# Patient Record
Sex: Female | Born: 1965 | Race: White | Hispanic: No | Marital: Married | Smoking: Never smoker
Health system: Southern US, Community
[De-identification: ages and names within clinical notes are randomized; demographics above are authoritative.]

## PROBLEM LIST (undated history)

## (undated) DIAGNOSIS — F32A Depression, unspecified: Secondary | ICD-10-CM

## (undated) DIAGNOSIS — E7800 Pure hypercholesterolemia, unspecified: Secondary | ICD-10-CM

## (undated) DIAGNOSIS — F329 Major depressive disorder, single episode, unspecified: Secondary | ICD-10-CM

## (undated) DIAGNOSIS — E119 Type 2 diabetes mellitus without complications: Secondary | ICD-10-CM

## (undated) HISTORY — DX: Major depressive disorder, single episode, unspecified: F32.9

## (undated) HISTORY — DX: Type 2 diabetes mellitus without complications: E11.9

## (undated) HISTORY — DX: Depression, unspecified: F32.A

## (undated) HISTORY — PX: SPINE SURGERY: SHX786

## (undated) HISTORY — PX: KNEE SURGERY: SHX244

## (undated) HISTORY — PX: OTHER SURGICAL HISTORY: SHX169

## (undated) HISTORY — DX: Pure hypercholesterolemia, unspecified: E78.00

## (undated) HISTORY — PX: CARPAL TUNNEL RELEASE: SHX101

---

## 2005-05-25 ENCOUNTER — Other Ambulatory Visit: Admission: RE | Admit: 2005-05-25 | Discharge: 2005-05-25 | Payer: Self-pay | Admitting: Obstetrics and Gynecology

## 2006-05-30 ENCOUNTER — Other Ambulatory Visit: Admission: RE | Admit: 2006-05-30 | Discharge: 2006-05-30 | Payer: Self-pay | Admitting: Obstetrics and Gynecology

## 2006-07-11 ENCOUNTER — Encounter: Admission: RE | Admit: 2006-07-11 | Discharge: 2006-07-11 | Payer: Self-pay | Admitting: Obstetrics and Gynecology

## 2007-07-13 ENCOUNTER — Encounter: Admission: RE | Admit: 2007-07-13 | Discharge: 2007-07-13 | Payer: Self-pay | Admitting: Obstetrics and Gynecology

## 2008-04-23 ENCOUNTER — Ambulatory Visit (HOSPITAL_COMMUNITY): Admission: RE | Admit: 2008-04-23 | Discharge: 2008-04-24 | Payer: Self-pay | Admitting: Neurosurgery

## 2008-08-05 ENCOUNTER — Encounter: Admission: RE | Admit: 2008-08-05 | Discharge: 2008-08-05 | Payer: Self-pay | Admitting: Obstetrics and Gynecology

## 2009-08-08 ENCOUNTER — Encounter: Admission: RE | Admit: 2009-08-08 | Discharge: 2009-08-08 | Payer: Self-pay | Admitting: Obstetrics and Gynecology

## 2010-08-11 ENCOUNTER — Encounter
Admission: RE | Admit: 2010-08-11 | Discharge: 2010-08-11 | Payer: Self-pay | Source: Home / Self Care | Attending: Obstetrics and Gynecology | Admitting: Obstetrics and Gynecology

## 2010-09-02 ENCOUNTER — Encounter
Admission: RE | Admit: 2010-09-02 | Discharge: 2010-09-02 | Payer: Self-pay | Source: Home / Self Care | Attending: Neurosurgery | Admitting: Neurosurgery

## 2011-01-12 NOTE — Op Note (Signed)
NAME:  Victoria Mcdowell                 ACCOUNT NO.:  [id number]   MEDICAL RECORD NO.:  [id number]          PATIENT TYPE:  OIB   LOCATION:  3533                         FACILITY:  [facility]   PHYSICIAN:  Hilda Lias, M.D.   DATE OF BIRTH:  [birth year]   DATE OF PROCEDURE:  DATE OF DISCHARGE:                               OPERATIVE REPORT   PREOPERATIVE DIAGNOSIS:  C5-C6 herniated disk with radiculopathy.   POSTOPERATIVE DIAGNOSIS:  C5-C6 herniated disk with radiculopathy.   PROCEDURES:  1. Anterior C5-C6 diskectomy.  2. Decompression of the spinal cord.  3. Bilateral foraminotomy.  4. Interbody fusion with allograft 8 mm and autograft in the middle      plate from Z6-X0, microscope.   SURGEON:  Hilda Lias, MD   ASSISTANT:  Stefani Dama, MD   CLINICAL HISTORY:  The patient was seen by me complaining of neck pain  radiating to both upper extremities.  X-rays show weakness of the  biceps.  The patient has failed conservative treatment.  Surgery was  advised and the risks were explained to her in the history and physical.   PROCEDURE:  The patient was taken to the OR and after intubation the  left side of the neck was cleaned with DuraPrep.  Drapes were applied.  Transverse incision was made through the skin, subcutaneous tissue down  to the cervical spine.  X-ray showed that indeed we were at the level of  C5-C6.  Then we brought the microscope into the area.  The anterior  ligament was opened with the curette, we did a total diskectomy, we  brought the microscope into the area.  We opened the posterior ligament  and there was a quite a bit of herniated disk centrally and bilaterally,  right worse than the left one.  Decompression of the spinal cord as well  as __________ nerve root was achieved.  Then the endplate were drilled.  An allograft of 8 mm with autograft in the middle was inserted followed  by a plate using 4 screws.  X-ray showed good position of the bone graft  at  the plate.  The area was irrigated.  We waited for 5 minutes just to  be sure that we have had good hemostasis.  Once this was achieved, the  wound was closed with Vicryl and Steri-Strips.           ______________________________  Hilda Lias, M.D.     EB/MEDQ  D:  04/23/2008  T:  04/24/2008  Job:  [id number]

## 2011-04-19 ENCOUNTER — Encounter (HOSPITAL_COMMUNITY)
Admission: RE | Admit: 2011-04-19 | Discharge: 2011-04-19 | Disposition: A | Discharge status: Home / Self Care | Payer: BC Managed Care – PPO | Source: Ambulatory Visit | Attending: Neurosurgery | Admitting: Neurosurgery

## 2011-04-19 LAB — BASIC METABOLIC PANEL
BUN: 13 mg/dL (ref 6–23)
CO2: 24 mEq/L (ref 19–32)
Chloride: 100 mEq/L (ref 96–112)
Creatinine, Ser: 0.62 mg/dL (ref 0.50–1.10)
Glucose, Bld: 265 mg/dL — ABNORMAL HIGH (ref 70–99)
Potassium: 4.7 mEq/L (ref 3.5–5.1)

## 2011-04-19 LAB — CBC
HCT: 39.5 % (ref 36.0–46.0)
Hemoglobin: 13.9 g/dL (ref 12.0–15.0)
MCH: 32 pg (ref 26.0–34.0)
MCV: 91 fL (ref 78.0–100.0)
Platelets: 222 10*3/uL (ref 150–400)
RBC: 4.34 MIL/uL (ref 3.87–5.11)

## 2011-04-23 ENCOUNTER — Inpatient Hospital Stay (HOSPITAL_COMMUNITY)
Admission: RE | Admit: 2011-04-23 | Discharge: 2011-04-28 | DRG: 756 | Disposition: A | Discharge status: Home / Self Care | Payer: BC Managed Care – PPO | Source: Ambulatory Visit | Attending: Neurosurgery | Admitting: Neurosurgery

## 2011-04-23 ENCOUNTER — Inpatient Hospital Stay (HOSPITAL_COMMUNITY): Payer: BC Managed Care – PPO

## 2011-04-23 DIAGNOSIS — Z01810 Encounter for preprocedural cardiovascular examination: Secondary | ICD-10-CM

## 2011-04-23 DIAGNOSIS — Q762 Congenital spondylolisthesis: Secondary | ICD-10-CM

## 2011-04-23 DIAGNOSIS — M51379 Other intervertebral disc degeneration, lumbosacral region without mention of lumbar back pain or lower extremity pain: Principal | ICD-10-CM | POA: Diagnosis present

## 2011-04-23 DIAGNOSIS — Z794 Long term (current) use of insulin: Secondary | ICD-10-CM

## 2011-04-23 DIAGNOSIS — M5137 Other intervertebral disc degeneration, lumbosacral region: Principal | ICD-10-CM | POA: Diagnosis present

## 2011-04-23 DIAGNOSIS — Z01812 Encounter for preprocedural laboratory examination: Secondary | ICD-10-CM

## 2011-04-23 DIAGNOSIS — E119 Type 2 diabetes mellitus without complications: Secondary | ICD-10-CM | POA: Diagnosis present

## 2011-04-23 LAB — ABO/RH: ABO/RH(D): A POS

## 2011-04-23 LAB — GLUCOSE, CAPILLARY
Glucose-Capillary: 152 mg/dL — ABNORMAL HIGH (ref 70–99)
Glucose-Capillary: 241 mg/dL — ABNORMAL HIGH (ref 70–99)

## 2011-04-23 LAB — TYPE AND SCREEN
ABO/RH(D): A POS
Antibody Screen: NEGATIVE

## 2011-04-24 LAB — GLUCOSE, CAPILLARY
Glucose-Capillary: 163 mg/dL — ABNORMAL HIGH (ref 70–99)
Glucose-Capillary: 289 mg/dL — ABNORMAL HIGH (ref 70–99)

## 2011-04-25 LAB — GLUCOSE, CAPILLARY: Glucose-Capillary: 293 mg/dL — ABNORMAL HIGH (ref 70–99)

## 2011-04-26 LAB — GLUCOSE, CAPILLARY
Glucose-Capillary: 251 mg/dL — ABNORMAL HIGH (ref 70–99)
Glucose-Capillary: 258 mg/dL — ABNORMAL HIGH (ref 70–99)

## 2011-04-27 LAB — GLUCOSE, CAPILLARY
Glucose-Capillary: 250 mg/dL — ABNORMAL HIGH (ref 70–99)
Glucose-Capillary: 240 mg/dL — ABNORMAL HIGH (ref 70–99)

## 2011-04-27 NOTE — Op Note (Signed)
NAME:  Victoria Mcdowell                 ACCOUNT NO.:  [id number]  MEDICAL RECORD NO.:  [id number]  LOCATION:  3037                         FACILITY:  [facility]  PHYSICIAN:  Hilda Lias, M.D.   DATE OF BIRTH:  [birth year]  DATE OF PROCEDURE: DATE OF DISCHARGE:                              OPERATIVE REPORT   ADMISSION DIAGNOSES:  L4-5 and L5-S1 fusion with stenosis,  L5-S1 degenerative disk disease with stenosis, radiculopathy, acute on chronic, status post L5-S1 diskectomy.  POSTOPERATIVE DIAGNOSES:  L4-5 and L5-S1 fusion with stenosis,  L5-S1 degenerative disk disease with stenosis, radiculopathy, acute on chronic, status post L5-S1 diskectomy.  PROCEDURES PERFORMED:  L4 and L5 laminectomy and facetectomy, bilateral L4-5 diskectomies _more than routinetwo_________ herniated disk with __________ interbody fusion with cages 12 x 22 __________ Pedicle screws L4, L5 as well. Posterolateral arthrodesis with autograft.  Cell Saver, C-arm.  SURGEON:  Hilda Lias, MD  ASSISTANT:  Coletta Memos, MD  CLINICAL HISTORY:  Ms. Victoria Mcdowell is a lady who in the past underwent L5-S1 diskectomy.  Along with that, she had anterior cervical diskectomy in May 2006.  I have been following her for 4 years, but her pain has continued to get worst.  The pain is mostly back pain, goes to both legs.  She was scheduled to have surgery in February but  she was afraid.  Nevertheless, she came to see me again with her partner about __________ 2 weeks ago, telling me that she was unable to walk and her pain was quite intense.  X-rays showed stenosis plus degenerative disease and spondylolisthesis at L4-5 and L5-S1 and surgery was advised. She and her family knew the risk of the surgery.  PROCEDURE IN DETAIL:  The patient was taken to the OR; and after intubation, she was positioned in a prone manner.  The back was cleaned with DuraPrep.  Midline incision from L3 down to L5-S1 was made through the skin and  subcutaneous tissue all the way down to the cervical spine. Retraction was done all the way laterally.  We were able to see the lateral aspect of the facet of L3, L4, L5, S1.  There is no question then what we found immediately was that the arch of  L4 was quite loose. We did x-ray, which showed that we were at the level of L4-5.  From then on, we proceeded with the removal of spinous process L4-5 as well as the lamina.  We went all the way beyond what normally we do removing along the facets of L4, L5  just to go all the way laterally into the disk space.  At the level of L4-5 diskectomy was accomplished.  The patient had quite a bit of degenerative disk.  The area was quite stenotic.  Decompression of the L3 and L4 nerve root was achieved.  The endplate were drilled..  We were able to insert two cages of 12 x 22 with autograft inside.  At the level of L5-S1, we found not only the stenosis, but quite a bit of adhesion bilaterally.  Lysis was accomplished and we were able to remove not only the lamina, the spinous process, as well as the facet.  We entered the disk space and the area was quite narrow.  The left side worse than right one.  We entered the disk space.  The endplate was removed and two cages of 10 x 22 with autograft were inserted.  Then, using the C-arm in AP view and then in a lateral view, we drilled the pedicle of L4-5, L5-S1.  Prior to insertion of the screws, we drilled the hole in the fourth quadrant just to be sure that was surrounded by bone.  Because of that, we introduced 6 screws with a diameter of 5.5.  The screws were from 40, 35 and 30 in length.  They were connected with a rod and kept in place with caps. A cross-link from left-to-right was used.  Then we went laterally and we removed periosteum of the lateral aspect of L4-5, L5-S1.  Hemostasis was accomplished. Then autograft was used for arthrodesis.  We came back again to the center and we probed the  foramen of L4-5 and S1 and there was plenty of space.  Valsalva maneuver was negative.  Fentanyl was left in the pleural space and the wound was closed with Vicryl and Steri-Strips.          ______________________________ Hilda Lias, M.D.     EB/MEDQ  D:  04/23/2011  T:  04/23/2011  Job:  [id number]  Electronically Signed by Hilda Lias M.D. on 04/27/2011 06:00:53 PM

## 2011-04-27 NOTE — H&P (Signed)
  NAMEMarland Kitchen  Osa Craver NO.:  [id number]  MEDICAL RECORD NO.:  [id number]  LOCATION:  2899                         FACILITY:  [facility]  PHYSICIAN:  Hilda Lias, M.D.   DATE OF BIRTH:  08-Dec-1965  DATE OF ADMISSION:  04/23/2011 DATE OF DISCHARGE:                             HISTORY & PHYSICAL   Ms. Victoria Mcdowell is a lady whom I have been followed for almost 4 years in my office complaining of back pain with radiation to both the legs associated with its thickness and weakness.  She back in 2009 because of her herniated disk in the cervical area and decompression and cervical fusion.  I had been followed her in my office secondary to her back pain.  She has had conservative treatment including physical therapy and medications.  She is not in bed lately.  The pain is constant.  She cannot sleep.  She cannot walk, and because of a clinical findings as well as laboratory findings, she is being admitted for surgery.  PAST MEDICAL HISTORY: 1. Arthroscopy of the knee. 2. Anterior cervical diskectomy.  ALLERGIES:  She is allergic to [facility].  FAMILY HISTORY:  Negative.  SOCIAL HISTORY:  The patient never smoked or drink.  REVIEW OF SYSTEMS:  Positive for high blood pressure, arthritis, diabetes, lower back pain, and leg pain.  PHYSICAL EXAMINATION:  GENERAL:  The patient came to my office and has physical therapist seen.  Every time she come to my office, with family physician. EARS, NOSE, AND THROAT:  Normal. NECK:  No difficulty on the neck. LUNGS:  Clear. HEART:  Heart sounds normal. ABDOMEN:  Normal. EXTREMITIES:  Normal pulses. NEUROLOGIC:  She has a weakness of dorsiflexion on both feet 4/5 with straight-leg raising, is positive of 45 degrees.  She had decreased upper extremity pain of the lumbar spine.  Lumbar x-ray shows degenerative disk disease with spondylolisthesis grade 1 at the level of L4-L5 and severe stenosis with facet arthropathy at the level  of L5-S1.  CLINICAL IMPRESSION:  Lumbar spondylolisthesis at L4-5 with degenerative disk disease and lumbar stenosis at the level of L5-S1.  RECOMMENDATIONS:  The patient is being admitted for surgery.  The procedure will be decompression and fusion at the level of L4-L5 and L5- S1 using pedicle screws and autograft.  The patient knew the risks involved with the surgery include CSF leak, worsening pain, paralysis, and no improvement whatsoever.          ______________________________ Hilda Lias, M.D.     EB/MEDQ  D:  04/23/2011  T:  04/23/2011  Job:  [id number]  Electronically Signed by Hilda Lias M.D. on 04/27/2011 05:57:51 PM

## 2011-04-28 LAB — GLUCOSE, CAPILLARY
Glucose-Capillary: 199 mg/dL — ABNORMAL HIGH (ref 70–99)
Glucose-Capillary: 183 mg/dL — ABNORMAL HIGH (ref 70–99)

## 2011-04-28 NOTE — Discharge Summary (Signed)
  NAMEAnnamarie Mcdowell                 ACCOUNT NO.:  [id number]  MEDICAL RECORD NO.:  [id number]  LOCATION:  3037                         FACILITY:  [facility]  PHYSICIAN:  Hilda Lias, M.D.   DATE OF BIRTH:  January 30, 1966  DATE OF ADMISSION:  04/23/2011 DATE OF DISCHARGE:  04/28/2011                              DISCHARGE SUMMARY   ADMISSION DIAGNOSES: 1. Degenerative disk disease with spondylolisthesis at the level of L4-     5, L5-S1 with chronic radiculopathy. 2. Status post anterior cervical fusion.  POSTOPERATIVE DIAGNOSES: 1. Degenerative disk disease with spondylolisthesis at the level of L4-     5, L5-S1 with chronic radiculopathy. 2. Status post anterior cervical fusion.  CLINICAL HISTORY:  The patient was admitted to the hospital because of back pain with radiation to both legs.  The pain continued to get worse. We were scheduled to have surgical procedure back in February, but she decided to wait.  She came to my office few weeks ago telling me that she cannot live with the pain.  X-ray showed spondylolisthesis and degenerative disk disease at L4-5, L5-S1 with a severe stenosis.  LABORATORY DATA:  Normal.  COURSE IN THE HOSPITAL:  The patient was taken to surgery and decompression and fusion was done at the level of L4-5 and L5-1.  Today, she is ambulating.  She still has some lower back pain, but the pain that she was having prior to surgery was gone.  She is being discharged today to be followed by me in my office.  CONDITION ON DISCHARGE:  Improving.  MEDICATION:  Percocet and baclofen.  DIET:  Regular.  ACTIVITY:  Not to drive for at least 2 weeks.  FOLLOWUP:  She will see me in 3 weeks or before as needed.          ______________________________ Hilda Lias, M.D.    EB/MEDQ  D:  04/28/2011  T:  04/28/2011  Job:  [id number]  Electronically Signed by Hilda Lias M.D. on 04/28/2011 05:01:01 PM

## 2012-09-15 IMAGING — CR DG LUMBAR SPINE 1V
1 series · 1 of 1 positions shown · non-contrast
Comparison: CT myelogram dated 09/02/2010.

CLINICAL DATA: L4-5 and L5-S1 discectomy and pedicle screw
placement.

LUMBAR SPINE - 1 VIEW

[view not recorded]
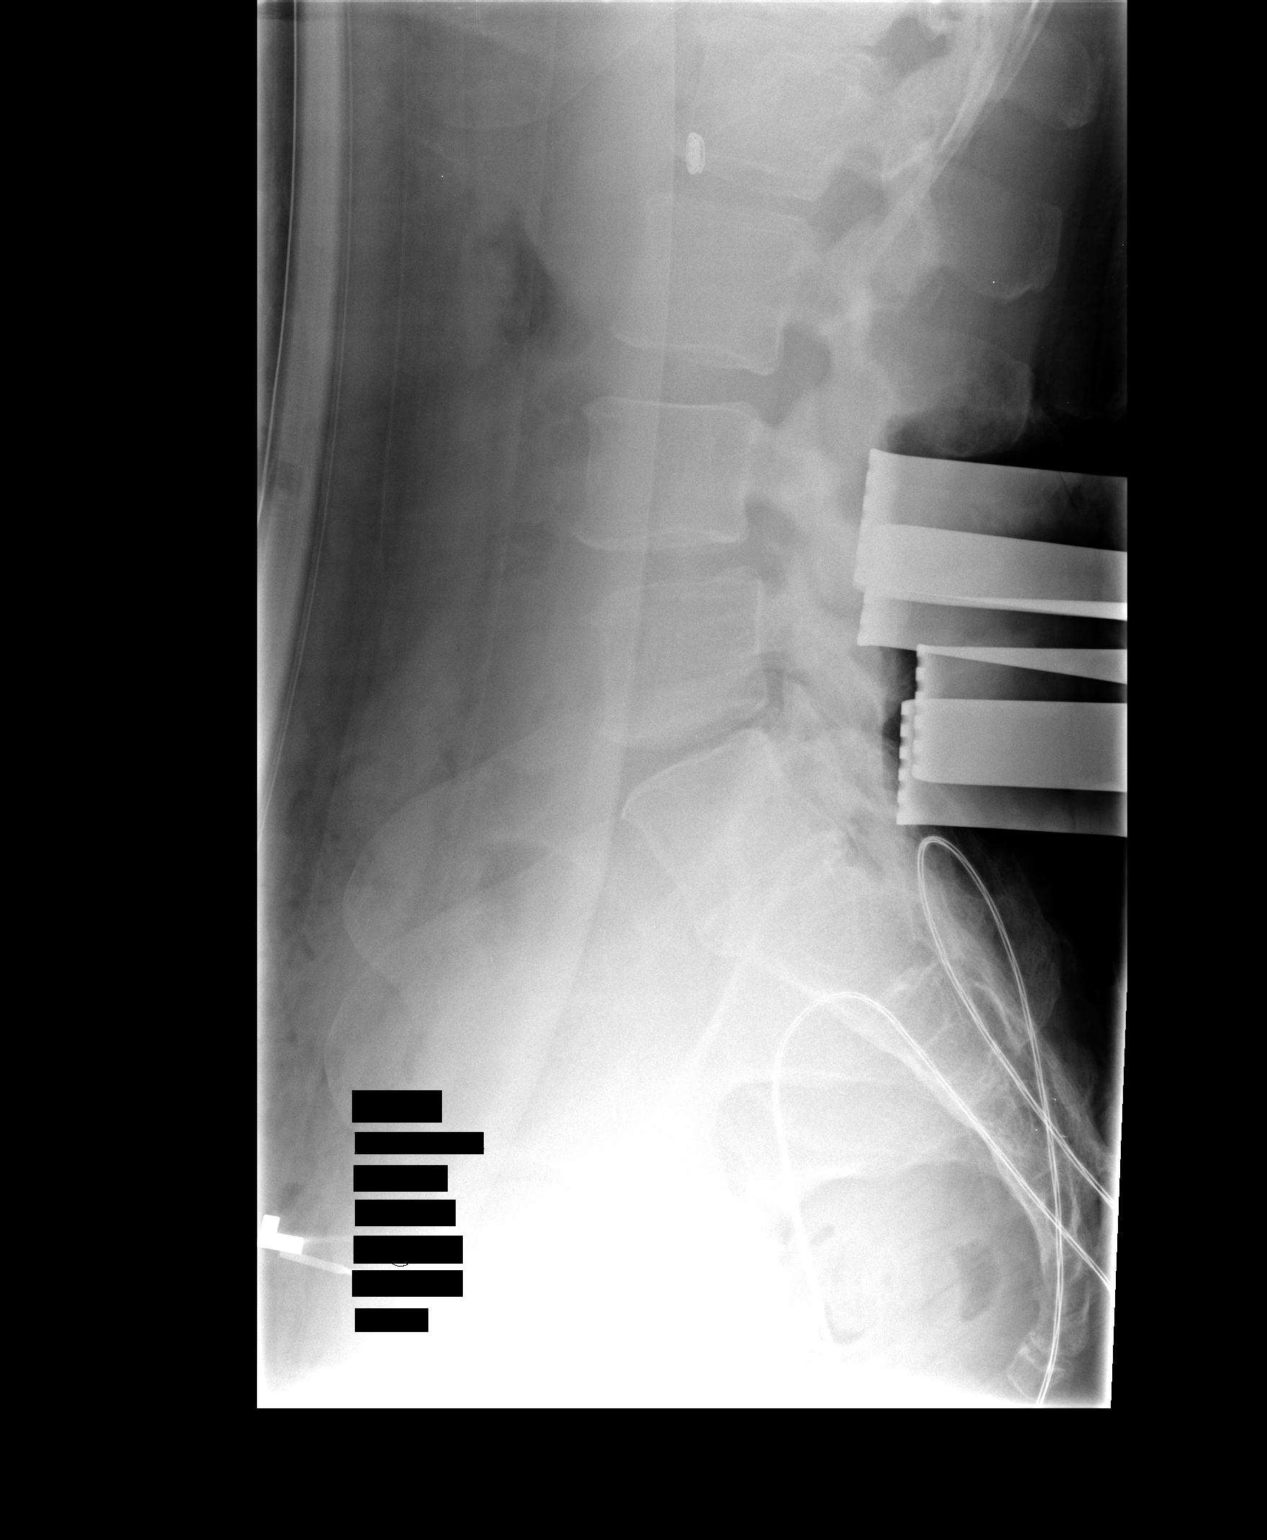

[1 of 1 positions shown; findings below may reference images not displayed]

FINDINGS: For counting purposes, the last open disc space is again
labeled the L5-S1 level.  A transitional S1 vertebra is again
demonstrated.  Surgical spreaders are demonstrated posteriorly at
the L4-5 and L5-1 levels.
IMPRESSION: Surgical spreaders at the L4-5 and L5-S1 levels.

## 2014-01-25 ENCOUNTER — Ambulatory Visit (INDEPENDENT_AMBULATORY_CARE_PROVIDER_SITE_OTHER): Payer: 59 | Admitting: Psychiatry

## 2014-01-25 ENCOUNTER — Encounter (HOSPITAL_COMMUNITY): Payer: Self-pay | Admitting: Psychiatry

## 2014-01-25 VITALS — BP 110/70 | Ht 66.0 in | Wt 195.0 lb

## 2014-01-25 DIAGNOSIS — F332 Major depressive disorder, recurrent severe without psychotic features: Secondary | ICD-10-CM

## 2014-01-25 DIAGNOSIS — F329 Major depressive disorder, single episode, unspecified: Secondary | ICD-10-CM

## 2014-01-25 MED ORDER — TRAZODONE HCL 50 MG PO TABS: 50.0000 mg | ORAL_TABLET | Freq: Every day | ORAL | Status: DC

## 2014-01-25 MED ORDER — DULOXETINE HCL 60 MG PO CPEP: 60.0000 mg | ORAL_CAPSULE | Freq: Every day | ORAL | Status: DC
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
## 2014-02-22 ENCOUNTER — Encounter (HOSPITAL_COMMUNITY): Payer: Self-pay | Admitting: Psychiatry

## 2014-02-22 ENCOUNTER — Ambulatory Visit (INDEPENDENT_AMBULATORY_CARE_PROVIDER_SITE_OTHER): Payer: 59 | Admitting: Psychiatry

## 2014-02-22 VITALS — BP 140/80 | Ht 66.0 in | Wt 188.0 lb

## 2014-02-22 DIAGNOSIS — F332 Major depressive disorder, recurrent severe without psychotic features: Secondary | ICD-10-CM

## 2014-02-22 DIAGNOSIS — F322 Major depressive disorder, single episode, severe without psychotic features: Secondary | ICD-10-CM

## 2014-02-22 MED ORDER — TRAZODONE HCL 50 MG PO TABS: 50.0000 mg | ORAL_TABLET | Freq: Every day | ORAL | Status: DC

## 2014-02-22 MED ORDER — DULOXETINE HCL 60 MG PO CPEP: 60.0000 mg | ORAL_CAPSULE | Freq: Every day | ORAL | Status: DC
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
## 2014-02-28 ENCOUNTER — Other Ambulatory Visit: Payer: Self-pay | Admitting: Neurosurgery

## 2014-02-28 DIAGNOSIS — M5416 Radiculopathy, lumbar region: Secondary | ICD-10-CM

## 2014-03-12 ENCOUNTER — Ambulatory Visit
Admission: RE | Admit: 2014-03-12 | Discharge: 2014-03-12 | Disposition: A | Discharge status: Home / Self Care | Payer: 59 | Source: Ambulatory Visit | Attending: Neurosurgery | Admitting: Neurosurgery

## 2014-03-12 VITALS — BP 114/64 | HR 92

## 2014-03-12 DIAGNOSIS — M5416 Radiculopathy, lumbar region: Secondary | ICD-10-CM

## 2014-03-12 MED ORDER — HYDROMORPHONE HCL PF 1 MG/ML IJ SOLN
1.0000 mg | Freq: Once | INTRAMUSCULAR | Status: AC
  Administered 2014-03-12: 1 mg via INTRAMUSCULAR

## 2014-03-12 MED ORDER — DIAZEPAM 5 MG PO TABS
10.0000 mg | ORAL_TABLET | Freq: Once | ORAL | Status: AC
  Administered 2014-03-12: 10 mg via ORAL

## 2014-03-12 MED ORDER — ONDANSETRON HCL 4 MG/2ML IJ SOLN
4.0000 mg | Freq: Once | INTRAMUSCULAR | Status: AC
  Administered 2014-03-12: 4 mg via INTRAMUSCULAR

## 2014-03-12 MED ORDER — IOHEXOL 180 MG/ML  SOLN
15.0000 mL | Freq: Once | INTRAMUSCULAR | Status: AC | PRN
  Administered 2014-03-12: 15 mL via INTRATHECAL

## 2014-03-12 NOTE — Discharge Instructions (Signed)
Myelogram Discharge Instructions  1. Go home and rest quietly for the next 24 hours.  It is important to lie flat for the next 24 hours.  Get up only to go to the restroom.  You may lie in the bed or on a couch on your back, your stomach, your left side or your right side.  You may have one pillow under your head.  You may have pillows between your knees while you are on your side or under your knees while you are on your back.  2. DO NOT drive today.  Recline the seat as far back as it will go, while still wearing your seat belt, on the way home.  3. You may get up to go to the bathroom as needed.  You may sit up for 10 minutes to eat.  You may resume your normal diet and medications unless otherwise indicated.  Drink plenty of extra fluids today and tomorrow.  4. The incidence of a spinal headache with nausea and/or vomiting is about 5% (one in 20 patients).  If you develop a headache, lie flat and drink plenty of fluids until the headache goes away.  Caffeinated beverages may be helpful.  If you develop severe nausea and vomiting or a headache that does not go away with flat bed rest, call [phone].  5. You may resume normal activities after your 24 hours of bed rest is over; however, do not exert yourself strongly or do any heavy lifting tomorrow.  6. Call your physician for a follow-up appointment.   You may resume Trazodone and Duloxetine on Wednesday, March 13, 2014 after 11:00am.

## 2014-03-12 NOTE — Progress Notes (Signed)
Patient states she has been off Duloxetine and Trazodone for at least the two days.  jkl

## 2014-04-02 ENCOUNTER — Encounter (HOSPITAL_COMMUNITY): Payer: Self-pay | Admitting: Psychiatry

## 2014-04-02 ENCOUNTER — Ambulatory Visit (INDEPENDENT_AMBULATORY_CARE_PROVIDER_SITE_OTHER): Payer: 59 | Admitting: Psychiatry

## 2014-04-02 VITALS — BP 110/70 | Ht 66.0 in | Wt 186.0 lb

## 2014-04-02 DIAGNOSIS — F322 Major depressive disorder, single episode, severe without psychotic features: Secondary | ICD-10-CM

## 2014-04-02 MED ORDER — DULOXETINE HCL 60 MG PO CPEP: 60.0000 mg | ORAL_CAPSULE | Freq: Every day | ORAL | Status: DC

## 2014-04-02 MED ORDER — TRAZODONE HCL 50 MG PO TABS: 50.0000 mg | ORAL_TABLET | Freq: Every day | ORAL | Status: DC
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
## 2014-06-03 ENCOUNTER — Telehealth (HOSPITAL_COMMUNITY): Payer: Self-pay | Admitting: *Deleted

## 2014-06-03 ENCOUNTER — Other Ambulatory Visit: Payer: Self-pay | Admitting: Neurosurgery

## 2014-06-03 DIAGNOSIS — M5481 Occipital neuralgia: Secondary | ICD-10-CM

## 2014-06-04 ENCOUNTER — Telehealth (HOSPITAL_COMMUNITY): Payer: Self-pay | Admitting: *Deleted

## 2014-06-04 ENCOUNTER — Other Ambulatory Visit (HOSPITAL_COMMUNITY): Payer: Self-pay | Admitting: Psychiatry

## 2014-06-04 MED ORDER — FLUOXETINE HCL 20 MG PO CAPS: 20.0000 mg | ORAL_CAPSULE | Freq: Every day | ORAL | Status: DC

## 2014-06-04 NOTE — Telephone Encounter (Signed)
I sent in Prozac 20 mg qam. Please let her know

## 2014-06-04 NOTE — Telephone Encounter (Signed)
Pt is aware and shows understanding 

## 2014-06-04 NOTE — Telephone Encounter (Signed)
This is a generic drug--no samples available

## 2014-06-04 NOTE — Telephone Encounter (Signed)
Pt calling stating that her insurance just changed and she do not have anymore Cymbalta. Pt states she is aware that there is no samples and she was wondering if Dr. Tenny Crawoss could call in something similar for 30 days until her insurance can kick in. Pt number is [phone].

## 2014-06-04 NOTE — Telephone Encounter (Signed)
Generic drug-- no samples available

## 2014-06-05 NOTE — Telephone Encounter (Signed)
Pt is aware and shows understanding 

## 2014-06-13 ENCOUNTER — Ambulatory Visit
Admission: RE | Admit: 2014-06-13 | Discharge: 2014-06-13 | Disposition: A | Discharge status: Home / Self Care | Payer: 59 | Source: Ambulatory Visit | Attending: Neurosurgery | Admitting: Neurosurgery

## 2014-06-13 VITALS — BP 113/65 | HR 80

## 2014-06-13 DIAGNOSIS — M5481 Occipital neuralgia: Secondary | ICD-10-CM

## 2014-06-13 DIAGNOSIS — M542 Cervicalgia: Secondary | ICD-10-CM

## 2014-06-13 DIAGNOSIS — M549 Dorsalgia, unspecified: Secondary | ICD-10-CM

## 2014-06-13 MED ORDER — DIAZEPAM 5 MG PO TABS
10.0000 mg | ORAL_TABLET | Freq: Once | ORAL | Status: AC
  Administered 2014-06-13: 10 mg via ORAL

## 2014-06-13 MED ORDER — IOHEXOL 300 MG/ML  SOLN
10.0000 mL | Freq: Once | INTRAMUSCULAR | Status: AC | PRN
  Administered 2014-06-13: 10 mL via INTRATHECAL

## 2014-06-13 MED ORDER — HYDROMORPHONE HCL 2 MG/ML IJ SOLN
1.5000 mg | Freq: Once | INTRAMUSCULAR | Status: AC
  Administered 2014-06-13: 1.5 mg via INTRAMUSCULAR

## 2014-06-13 MED ORDER — ONDANSETRON HCL 4 MG/2ML IJ SOLN
4.0000 mg | Freq: Once | INTRAMUSCULAR | Status: AC
  Administered 2014-06-13: 4 mg via INTRAMUSCULAR

## 2014-06-13 NOTE — Discharge Instructions (Signed)
Myelogram Discharge Instructions  1. Go home and rest quietly for the next 24 hours.  It is important to lie flat for the next 24 hours.  Get up only to go to the restroom.  You may lie in the bed or on a couch on your back, your stomach, your left side or your right side.  You may have one pillow under your head.  You may have pillows between your knees while you are on your side or under your knees while you are on your back.  2. DO NOT drive today.  Recline the seat as far back as it will go, while still wearing your seat belt, on the way home.  3. You may get up to go to the bathroom as needed.  You may sit up for 10 minutes to eat.  You may resume your normal diet and medications unless otherwise indicated.  Drink lots of extra fluids today and tomorrow.  4. The incidence of headache, nausea, or vomiting is about 5% (one in 20 patients).  If you develop a headache, lie flat and drink plenty of fluids until the headache goes away.  Caffeinated beverages may be helpful.  If you develop severe nausea and vomiting or a headache that does not go away with flat bed rest, call [phone].  5. You may resume normal activities after your 24 hours of bed rest is over; however, do not exert yourself strongly or do any heavy lifting tomorrow. If when you get up you have a headache when standing, go back to bed and force fluids for another 24 hours.  6. Call your physician for a follow-up appointment.  The results of your myelogram will be sent directly to your physician by the following day.  7. If you have any questions or if complications develop after you arrive home, please call [phone].  Discharge instructions have been explained to the patient.  The patient, or the person responsible for the patient, fully understands these instructions.      May resume Cymabalta and Trazodone on Oct. 16, 2015, after 11:00 am.

## 2014-07-03 ENCOUNTER — Ambulatory Visit (INDEPENDENT_AMBULATORY_CARE_PROVIDER_SITE_OTHER): Payer: 59 | Admitting: Psychiatry

## 2014-07-03 ENCOUNTER — Encounter (HOSPITAL_COMMUNITY): Payer: Self-pay | Admitting: Psychiatry

## 2014-07-03 VITALS — BP 112/59 | HR 96 | Ht 66.0 in | Wt 174.0 lb

## 2014-07-03 DIAGNOSIS — F322 Major depressive disorder, single episode, severe without psychotic features: Secondary | ICD-10-CM

## 2014-07-03 MED ORDER — TRAZODONE HCL 50 MG PO TABS: 50.0000 mg | ORAL_TABLET | Freq: Every day | ORAL | Status: DC

## 2014-07-03 MED ORDER — DULOXETINE HCL 60 MG PO CPEP: 60.0000 mg | ORAL_CAPSULE | Freq: Every day | ORAL | Status: DC
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
## 2014-08-30 HISTORY — PX: BREAST BIOPSY: SHX20

## 2014-09-03 ENCOUNTER — Ambulatory Visit (INDEPENDENT_AMBULATORY_CARE_PROVIDER_SITE_OTHER): Payer: 59 | Admitting: Psychiatry

## 2014-09-03 ENCOUNTER — Encounter (HOSPITAL_COMMUNITY): Payer: Self-pay | Admitting: Psychiatry

## 2014-09-03 VITALS — BP 122/69 | HR 100 | Ht 66.0 in | Wt 176.2 lb

## 2014-09-03 DIAGNOSIS — F322 Major depressive disorder, single episode, severe without psychotic features: Secondary | ICD-10-CM

## 2014-09-03 MED ORDER — TRAZODONE HCL 100 MG PO TABS: 100.0000 mg | ORAL_TABLET | Freq: Every day | ORAL | Status: DC

## 2014-09-03 MED ORDER — DULOXETINE HCL 60 MG PO CPEP: 60.0000 mg | ORAL_CAPSULE | Freq: Two times a day (BID) | ORAL | Status: DC
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
## 2014-10-01 ENCOUNTER — Ambulatory Visit (INDEPENDENT_AMBULATORY_CARE_PROVIDER_SITE_OTHER): Payer: 59 | Admitting: Psychiatry

## 2014-10-01 ENCOUNTER — Encounter (HOSPITAL_COMMUNITY): Payer: Self-pay | Admitting: Psychiatry

## 2014-10-01 VITALS — BP 97/55 | HR 98 | Ht 66.0 in | Wt 179.6 lb

## 2014-10-01 DIAGNOSIS — F322 Major depressive disorder, single episode, severe without psychotic features: Secondary | ICD-10-CM

## 2014-10-01 MED ORDER — TRAZODONE HCL 100 MG PO TABS: 100.0000 mg | ORAL_TABLET | Freq: Every day | ORAL | Status: DC

## 2014-10-01 MED ORDER — DULOXETINE HCL 60 MG PO CPEP: 60.0000 mg | ORAL_CAPSULE | Freq: Two times a day (BID) | ORAL | Status: DC
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
## 2014-11-29 ENCOUNTER — Ambulatory Visit (INDEPENDENT_AMBULATORY_CARE_PROVIDER_SITE_OTHER): Payer: 59 | Admitting: Psychiatry

## 2014-11-29 ENCOUNTER — Encounter (HOSPITAL_COMMUNITY): Payer: Self-pay | Admitting: Psychiatry

## 2014-11-29 VITALS — BP 106/49 | HR 93 | Ht 66.0 in | Wt 184.0 lb

## 2014-11-29 DIAGNOSIS — F322 Major depressive disorder, single episode, severe without psychotic features: Secondary | ICD-10-CM

## 2014-11-29 MED ORDER — TRAZODONE HCL 150 MG PO TABS: 150.0000 mg | ORAL_TABLET | Freq: Every day | ORAL | Status: DC

## 2014-11-29 MED ORDER — DULOXETINE HCL 60 MG PO CPEP: 60.0000 mg | ORAL_CAPSULE | Freq: Two times a day (BID) | ORAL | Status: DC
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
## 2014-12-25 ENCOUNTER — Other Ambulatory Visit: Payer: Self-pay | Admitting: Obstetrics and Gynecology

## 2014-12-25 DIAGNOSIS — R928 Other abnormal and inconclusive findings on diagnostic imaging of breast: Secondary | ICD-10-CM

## 2014-12-31 ENCOUNTER — Other Ambulatory Visit: Payer: Self-pay

## 2015-01-03 ENCOUNTER — Ambulatory Visit
Admission: RE | Admit: 2015-01-03 | Discharge: 2015-01-03 | Disposition: A | Discharge status: Home / Self Care | Payer: 59 | Source: Ambulatory Visit | Attending: Obstetrics and Gynecology | Admitting: Obstetrics and Gynecology

## 2015-01-03 ENCOUNTER — Other Ambulatory Visit: Payer: Self-pay | Admitting: Obstetrics and Gynecology

## 2015-01-03 DIAGNOSIS — R928 Other abnormal and inconclusive findings on diagnostic imaging of breast: Secondary | ICD-10-CM

## 2015-01-08 ENCOUNTER — Other Ambulatory Visit: Payer: Self-pay | Admitting: Obstetrics and Gynecology

## 2015-01-08 DIAGNOSIS — R928 Other abnormal and inconclusive findings on diagnostic imaging of breast: Secondary | ICD-10-CM

## 2015-01-09 ENCOUNTER — Other Ambulatory Visit: Payer: Self-pay | Admitting: Obstetrics and Gynecology

## 2015-01-09 ENCOUNTER — Ambulatory Visit
Admission: RE | Admit: 2015-01-09 | Discharge: 2015-01-09 | Disposition: A | Discharge status: Home / Self Care | Payer: Managed Care, Other (non HMO) | Source: Ambulatory Visit | Attending: Obstetrics and Gynecology | Admitting: Obstetrics and Gynecology

## 2015-01-09 ENCOUNTER — Encounter (INDEPENDENT_AMBULATORY_CARE_PROVIDER_SITE_OTHER): Payer: Self-pay

## 2015-01-09 DIAGNOSIS — R928 Other abnormal and inconclusive findings on diagnostic imaging of breast: Secondary | ICD-10-CM

## 2015-02-04 ENCOUNTER — Ambulatory Visit (HOSPITAL_COMMUNITY): Payer: Self-pay | Admitting: Psychiatry

## 2015-02-27 ENCOUNTER — Ambulatory Visit (HOSPITAL_COMMUNITY): Payer: Managed Care, Other (non HMO) | Admitting: Psychiatry

## 2015-03-04 ENCOUNTER — Ambulatory Visit (INDEPENDENT_AMBULATORY_CARE_PROVIDER_SITE_OTHER): Payer: Managed Care, Other (non HMO) | Admitting: Psychiatry

## 2015-03-04 ENCOUNTER — Encounter (HOSPITAL_COMMUNITY): Payer: Self-pay | Admitting: Psychiatry

## 2015-03-04 VITALS — BP 99/66 | HR 96 | Ht 66.0 in | Wt 178.6 lb

## 2015-03-04 DIAGNOSIS — F332 Major depressive disorder, recurrent severe without psychotic features: Secondary | ICD-10-CM | POA: Diagnosis not present

## 2015-03-04 DIAGNOSIS — F322 Major depressive disorder, single episode, severe without psychotic features: Secondary | ICD-10-CM

## 2015-03-04 MED ORDER — DULOXETINE HCL 60 MG PO CPEP: 60.0000 mg | ORAL_CAPSULE | Freq: Two times a day (BID) | ORAL | Status: DC

## 2015-03-04 MED ORDER — TRAZODONE HCL 150 MG PO TABS: 150.0000 mg | ORAL_TABLET | Freq: Every day | ORAL | Status: DC
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
## 2015-04-10 ENCOUNTER — Other Ambulatory Visit: Payer: Self-pay | Admitting: Neurosurgery

## 2015-04-10 DIAGNOSIS — M5416 Radiculopathy, lumbar region: Secondary | ICD-10-CM

## 2015-04-23 ENCOUNTER — Ambulatory Visit
Admission: RE | Admit: 2015-04-23 | Discharge: 2015-04-23 | Disposition: A | Discharge status: Home / Self Care | Payer: Managed Care, Other (non HMO) | Source: Ambulatory Visit | Attending: Neurosurgery | Admitting: Neurosurgery

## 2015-04-23 ENCOUNTER — Ambulatory Visit
Admission: RE | Admit: 2015-04-23 | Discharge: 2015-04-23 | Disposition: A | Discharge status: Home / Self Care | Payer: 59 | Source: Ambulatory Visit | Attending: Neurosurgery | Admitting: Neurosurgery

## 2015-04-23 DIAGNOSIS — M5416 Radiculopathy, lumbar region: Secondary | ICD-10-CM

## 2015-04-23 MED ORDER — ONDANSETRON HCL 4 MG/2ML IJ SOLN: 4.0000 mg | Freq: Four times a day (QID) | INTRAMUSCULAR | Status: DC | PRN

## 2015-04-23 MED ORDER — HYDROMORPHONE HCL 2 MG/ML IJ SOLN
2.0000 mg | Freq: Once | INTRAMUSCULAR | Status: AC
  Administered 2015-04-23: 2 mg via INTRAMUSCULAR

## 2015-04-23 MED ORDER — ONDANSETRON 8 MG PO TBDP
8.0000 mg | ORAL_TABLET | Freq: Once | ORAL | Status: AC
  Administered 2015-04-23: 8 mg via ORAL

## 2015-04-23 MED ORDER — IOHEXOL 180 MG/ML  SOLN
15.0000 mL | Freq: Once | INTRAMUSCULAR | Status: DC | PRN
  Administered 2015-04-23: 15 mL via INTRATHECAL

## 2015-04-23 MED ORDER — DIAZEPAM 5 MG PO TABS
10.0000 mg | ORAL_TABLET | Freq: Once | ORAL | Status: AC
  Administered 2015-04-23: 10 mg via ORAL

## 2015-04-23 NOTE — Discharge Instructions (Signed)
Myelogram Discharge Instructions  1. Go home and rest quietly for the next 24 hours.  It is important to lie flat for the next 24 hours.  Get up only to go to the restroom.  You may lie in the bed or on a couch on your back, your stomach, your left side or your right side.  You may have one pillow under your head.  You may have pillows between your knees while you are on your side or under your knees while you are on your back.  2. DO NOT drive today.  Recline the seat as far back as it will go, while still wearing your seat belt, on the way home.  3. You may get up to go to the bathroom as needed.  You may sit up for 10 minutes to eat.  You may resume your normal diet and medications unless otherwise indicated.  Drink lots of extra fluids today and tomorrow.  4. The incidence of headache, nausea, or vomiting is about 5% (one in 20 patients).  If you develop a headache, lie flat and drink plenty of fluids until the headache goes away.  Caffeinated beverages may be helpful.  If you develop severe nausea and vomiting or a headache that does not go away with flat bed rest, call [phone].  5. You may resume normal activities after your 24 hours of bed rest is over; however, do not exert yourself strongly or do any heavy lifting tomorrow. If when you get up you have a headache when standing, go back to bed and force fluids for another 24 hours.  6. Call your physician for a follow-up appointment.  The results of your myelogram will be sent directly to your physician by the following day.  7. If you have any questions or if complications develop after you arrive home, please call [phone].  Discharge instructions have been explained to the patient.  The patient, or the person responsible for the patient, fully understands these instructions.       May resume Cymbalta and Trazodone on Aug. 25, 2016, after 11:00 am.

## 2015-04-30 ENCOUNTER — Telehealth (HOSPITAL_COMMUNITY): Payer: Self-pay | Admitting: *Deleted

## 2015-04-30 MED ORDER — DULOXETINE HCL 60 MG PO CPEP: 60.0000 mg | ORAL_CAPSULE | Freq: Two times a day (BID) | ORAL | Status: DC

## 2015-04-30 MED ORDER — TRAZODONE HCL 150 MG PO TABS: 150.0000 mg | ORAL_TABLET | Freq: Every day | ORAL | Status: DC

## 2015-04-30 NOTE — Telephone Encounter (Signed)
Pt pharmacy called requesting refills for pt Trazodone and Cymbalta. Spoke to [place] that pt should not be out of her medications and if they received escript for 03-04-15. Per Misty Stanley, they did not receive scripts. Per Misty Stanley, there are several [facility]. Verified address office sent scripts to and Misty Stanley denied address and stated that was not their address. Per Misty Stanley, this is a mail order and insurance will only pay for 90 days supply at a time not 30 days. Informed Misty Stanley that scripts will be sent to them and Provider will be notified as well. Misty Stanley agreed.

## 2015-04-30 NOTE — Telephone Encounter (Signed)
noted 

## 2015-05-01 ENCOUNTER — Telehealth (HOSPITAL_COMMUNITY): Payer: Self-pay | Admitting: *Deleted

## 2015-05-09 ENCOUNTER — Telehealth (HOSPITAL_COMMUNITY): Payer: Self-pay | Admitting: *Deleted

## 2015-06-02 ENCOUNTER — Ambulatory Visit (HOSPITAL_COMMUNITY): Payer: Managed Care, Other (non HMO) | Admitting: Psychiatry

## 2015-06-09 ENCOUNTER — Encounter (HOSPITAL_COMMUNITY): Payer: Self-pay | Admitting: Psychiatry

## 2015-06-09 ENCOUNTER — Ambulatory Visit (INDEPENDENT_AMBULATORY_CARE_PROVIDER_SITE_OTHER): Payer: 59 | Admitting: Psychiatry

## 2015-06-09 VITALS — BP 114/75 | HR 92 | Ht 66.0 in | Wt 191.6 lb

## 2015-06-09 DIAGNOSIS — F322 Major depressive disorder, single episode, severe without psychotic features: Secondary | ICD-10-CM

## 2015-06-09 MED ORDER — DULOXETINE HCL 60 MG PO CPEP: 60.0000 mg | ORAL_CAPSULE | Freq: Two times a day (BID) | ORAL | Status: DC

## 2015-06-09 MED ORDER — TRAZODONE HCL 150 MG PO TABS: 150.0000 mg | ORAL_TABLET | Freq: Every day | ORAL | Status: DC
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
## 2015-06-16 ENCOUNTER — Other Ambulatory Visit: Payer: Self-pay | Admitting: Obstetrics and Gynecology

## 2015-06-16 DIAGNOSIS — N63 Unspecified lump in unspecified breast: Secondary | ICD-10-CM

## 2015-07-14 ENCOUNTER — Other Ambulatory Visit: Payer: 59

## 2015-07-22 ENCOUNTER — Ambulatory Visit
Admission: RE | Admit: 2015-07-22 | Discharge: 2015-07-22 | Disposition: A | Discharge status: Home / Self Care | Payer: 59 | Source: Ambulatory Visit | Attending: Obstetrics and Gynecology | Admitting: Obstetrics and Gynecology

## 2015-07-22 DIAGNOSIS — N63 Unspecified lump in unspecified breast: Secondary | ICD-10-CM

## 2015-09-09 ENCOUNTER — Ambulatory Visit (HOSPITAL_COMMUNITY): Payer: 59 | Admitting: Psychiatry

## 2015-10-06 ENCOUNTER — Ambulatory Visit (HOSPITAL_COMMUNITY): Payer: 59 | Admitting: Psychiatry

## 2015-10-24 ENCOUNTER — Encounter (HOSPITAL_COMMUNITY): Payer: Self-pay | Admitting: Psychiatry

## 2015-10-24 ENCOUNTER — Ambulatory Visit (INDEPENDENT_AMBULATORY_CARE_PROVIDER_SITE_OTHER): Payer: 59 | Admitting: Psychiatry

## 2015-10-24 VITALS — BP 97/74 | HR 103 | Ht 66.0 in | Wt 187.0 lb

## 2015-10-24 DIAGNOSIS — F322 Major depressive disorder, single episode, severe without psychotic features: Secondary | ICD-10-CM | POA: Diagnosis not present

## 2015-10-24 MED ORDER — TRAZODONE HCL 150 MG PO TABS: 150.0000 mg | ORAL_TABLET | Freq: Every day | ORAL | Status: DC

## 2015-10-24 MED ORDER — DULOXETINE HCL 60 MG PO CPEP: 60.0000 mg | ORAL_CAPSULE | Freq: Two times a day (BID) | ORAL | Status: DC
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
## 2015-11-06 IMAGING — RF DG MYELOGRAPHY LUMBAR INJ MULTI REGION
12 of 21 series · 12 of 21 positions shown · non-contrast
Comparison: none

CLINICAL DATA: Previous lumbar decompression and fusion. Low back
pain and left leg pain. Neck pain and occipital headache.
TECHNIQUE: Contiguous axial images were obtained through the Cervical,
Thoracic, and Lumbar spine after the intrathecal infusion of
infusion. Coronal and sagittal reconstructions were obtained of the
axial image sets.

[Series 1: (hospital) · 1 of 1 slices shown]
[im 1/1]
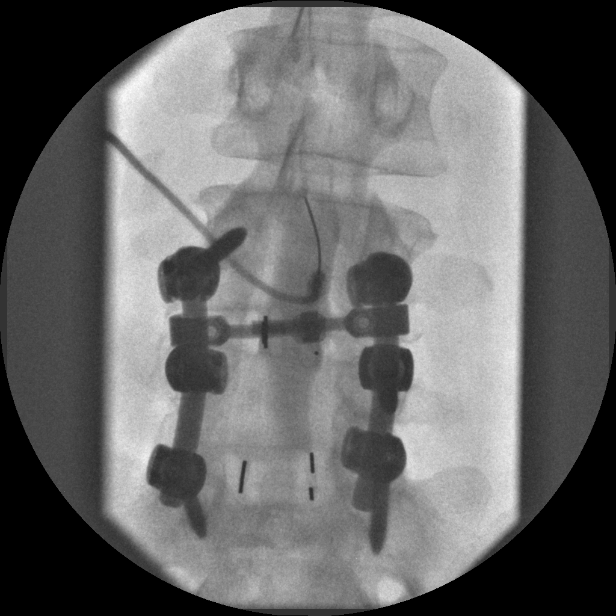

[Series 3: myelogram  white · 1 of 1 slices shown (1 of 8)]
[im 1/1]
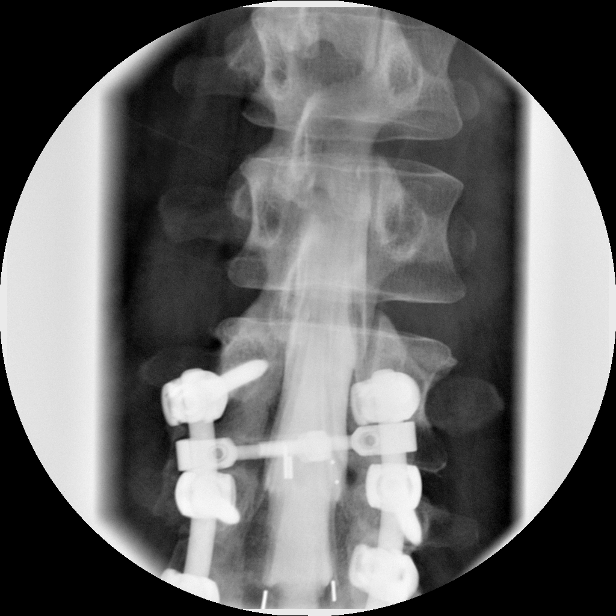

[Series 5: myelogram  white · 1 of 1 slices shown (2 of 8)]
[im 1/1]
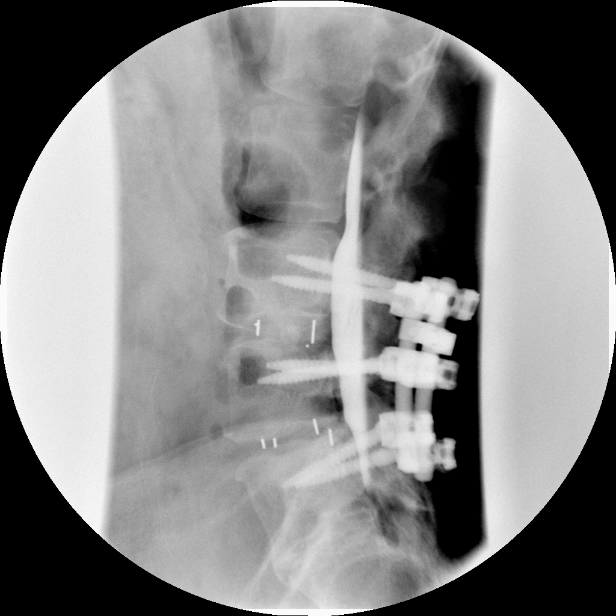

[Series 7: myelogram  white · 1 of 1 slices shown (3 of 8)]
[im 1/1]
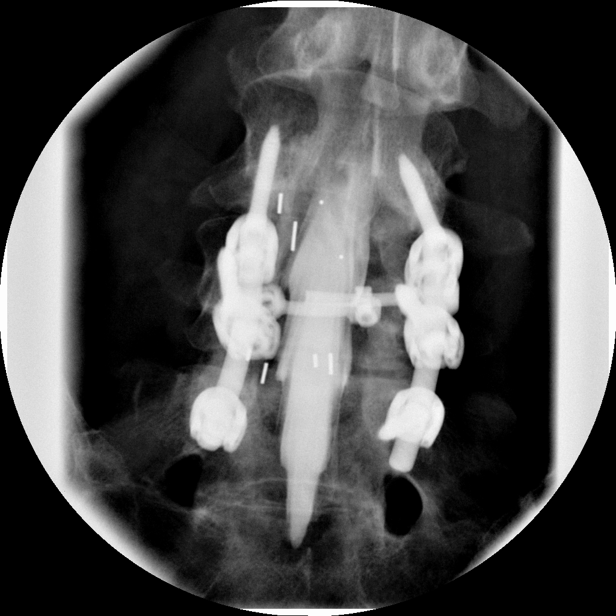

[Series 8: myelogram  white · 1 of 1 slices shown (4 of 8)]
[im 1/1]
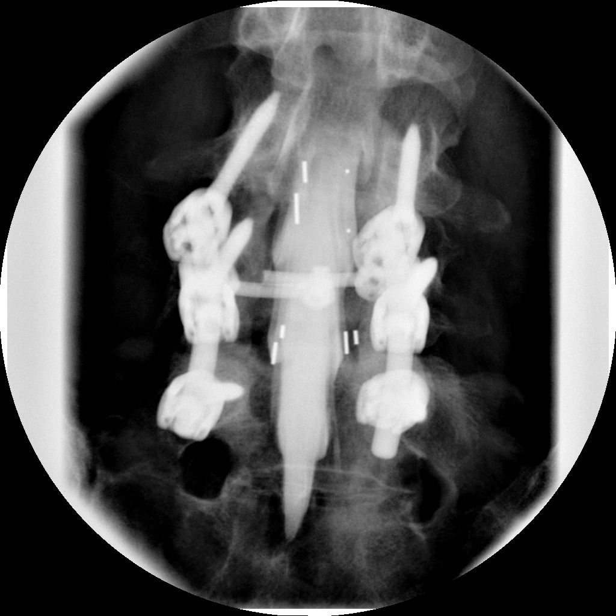

[Series 10: myelogram  white · 1 of 1 slices shown (5 of 8)]
[im 1/1]
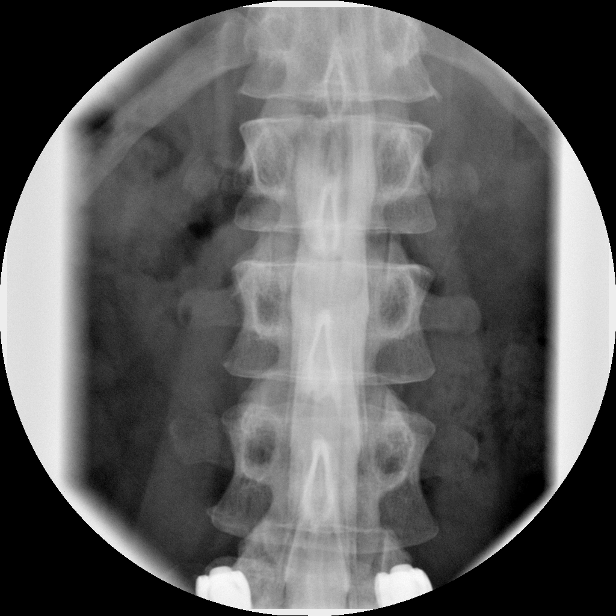

[Series 12: myelogram  white · 1 of 1 slices shown (6 of 8)]
[im 1/1]
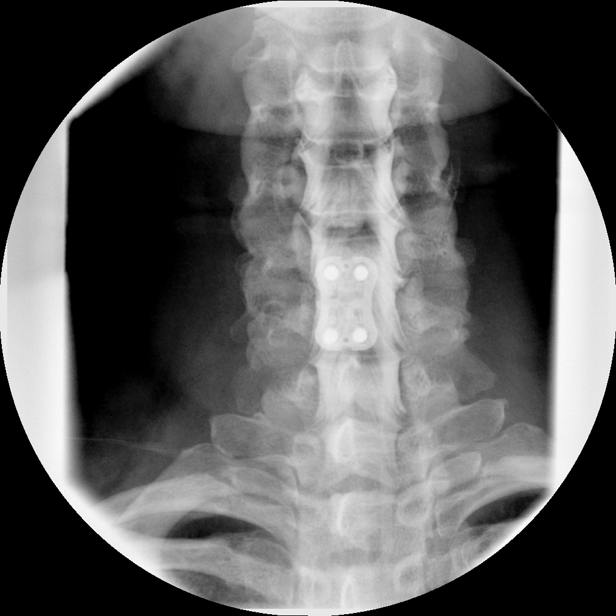

[Series 14: myelogram  white · 1 of 1 slices shown (7 of 8)]
[im 1/1]
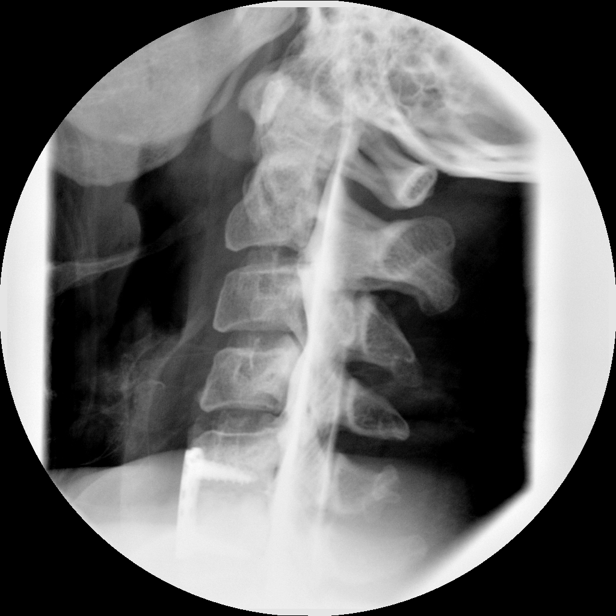

[Series 15: myelogram  white · 1 of 1 slices shown (8 of 8)]
[im 1/1]
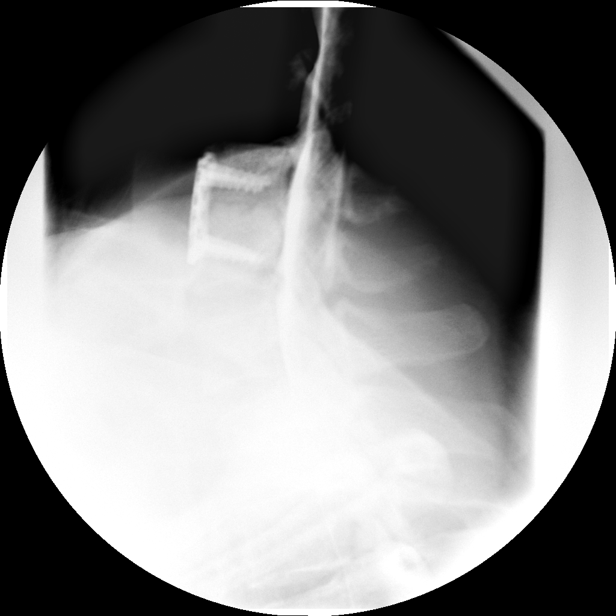

[Series 1002: view not recorded · 0.15mm/px · 1 of 1 slices shown (1 of 3)]
[im 1/1]
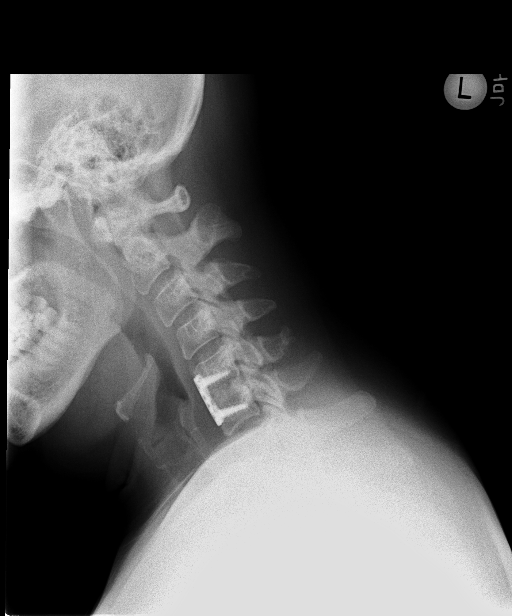

[Series 1004: view not recorded · 0.20mm/px · 1 of 1 slices shown (2 of 3)]
[im 1/1]
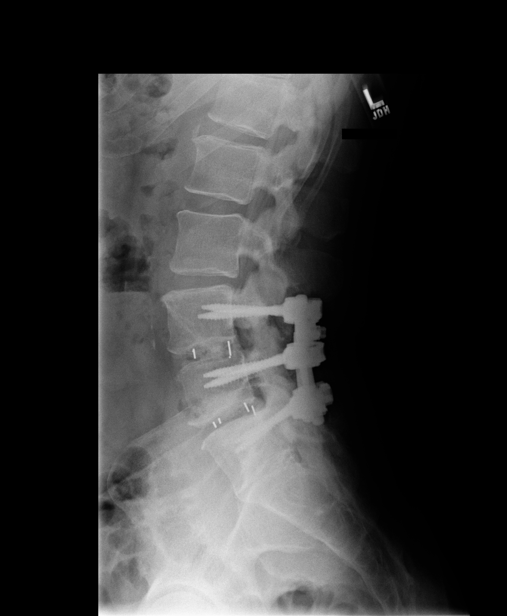

[Series 1006: view not recorded · 0.20mm/px · 1 of 1 slices shown (3 of 3)]
[im 1/1]
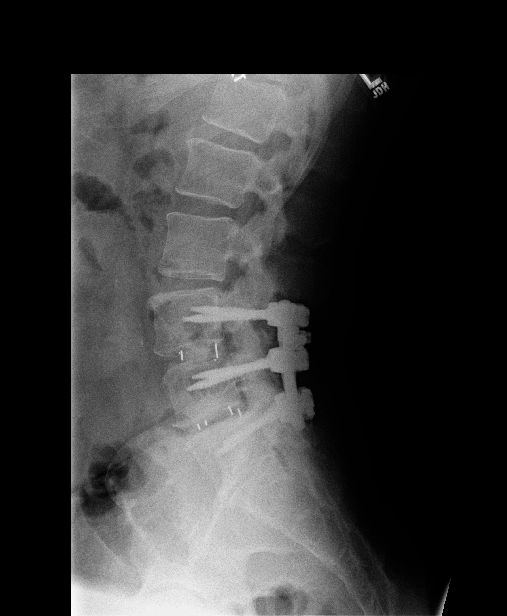

[12 of 21 positions shown; findings below may reference images not displayed]

FLUOROSCOPY TIME:  1 min 28 seconds

PROCEDURE:
LUMBAR PUNCTURE FOR CERVICAL LUMBAR  MYELOGRAM

CERVICAL AND LUMBAR MYELOGRAM

CT CERVICAL MYELOGRAM

CT LUMBAR MYELOGRAM

After thorough discussion of risks and benefits of the procedure
including bleeding, infection, injury to nerves, blood vessels,
adjacent structures as well as headache and CSF leak, written and
oral informed consent was obtained. Consent was obtained by Dr. Nizthar
Es.

Patient was positioned prone on the fluoroscopy table. Local
anesthesia was provided with 1% lidocaine without epinephrine after
prepped and draped in the usual sterile fashion. Puncture was
performed at L3-4 using a 3 1/2 inch 22-gauge spinal needle via
right approach. Using a single pass through the dura, the needle was
placed within the thecal sac, with return of clear CSF. 10 mL
Emnipaque-3GG was injected into the thecal sac, with normal
opacification of the nerve roots and cauda equina consistent with
free flow within the subarachnoid space. The patient was then moved
to the trendelenburg position and contrast flowed into the Thoracic
and Cervical spine regions.

I personally performed the lumbar puncture and administered the
intrathecal contrast. I also personally performed acquisition of the
myelogram images.
FINDINGS: CERVICAL AND LUMBAR MYELOGRAM FINDINGS:

Same numbering terminology use does on the previous study, with S1
being transitional.

There has been previous decompression and fusion from L4-S1. There
is a small anterior extradural defect at L3-4. No apparent neural
compression. There is wide patency of the canal in the fusion
segment from L4 to the sacrum. No evidence of hardware complication.
Standing flexion extension views show slight rocking motion at the
L3-4 level.

In the cervical region, there has been previous anterior cervical
discectomy and fusion at C5-6. There is wide patency of the central
canal. No abnormality of root sleeve filling is demonstrated. There
is a small anterior extradural defect at C3-4. No motion occurs in
the cervical spine with flexion extension.

CT CERVICAL MYELOGRAM FINDINGS:

The foramen magnum is widely patent. One can appreciate partial
opacification of the sphenoid sinus, not fully evaluated. C1-2 shows
mild osteoarthritis but no stenosis.

C2-3:  Normal interspace.

C3-4: Mild bulging of the disc. Small uncovertebral osteophytes. No
central canal stenosis. Mild foraminal narrowing, not grossly
compressive.

C4-5: Small endplate osteophytes and mild bulging of the disc. No
compressive central canal stenosis. Foraminal narrowing left more
than right. There would be some potential to affect the left C5
nerve root.

C5-6: Previous discectomy and fusion. I think fusion is solid. No
stenosis.

C6-7: Minimal bulging of the disc.  No canal or foraminal stenosis.

C7-T1:  Normal interspace.

CT LUMBAR MYELOGRAM FINDINGS:

As noted previously, S1 is transitional. This is the same numbering
terminology use on the previous study.

T12-L1: Chronic left paracentral disc herniation indents the left
side of the thecal sac slightly but does not appear to cause neural
compression.

L1-2:  Normal interspace.  Conus tip at this level.

L2-3:  Normal interspace.

L3-4: Circumferential protrusion of the disc. Mild facet and
ligamentous hypertrophy. Stenosis of both lateral recesses that
could cause neural compression. This likely worsens with standing
and flexion.

L4-S1: Previous decompression, diskectomy and fusion. Fusion is
solid. There is sufficient patency of the canal and foramina. Nerve
root distribution is slightly peripheral suggesting some degree
arachnoiditis. No other complicating features.

S1-2: Transitional and unremarkable. Solid fusion anteriorly and
posteriorly at this level.

There is mild osteoarthritis of the sacroiliac joints.
IMPRESSION: Lumbar region: Previous diskectomy, decompression and fusion from
L4-S1 has a good appearance. Solid union. No stenosis. Some degree
of arachnoiditis.

Bilateral lateral recess stenosis at L3-4 because of circumferential
protrusion of the disc in combination with mild facet and
ligamentous hypertrophy. Abnormal rocking motion at this level at
flexion-extension myelography, at which time the lateral recess
stenosis could worsen.

Cervical region: Previous anterior cervical discectomy and fusion at
C5-6 has a good appearance.

Mild degenerative spondylosis at C3-4 and C4-5. No central canal
stenosis. Foraminal narrowing on the left at C4-5 that could
possibly affect the left C5 nerve root.

## 2015-11-06 IMAGING — CT CT L SPINE W/ CM
4 of 10 series · 13 of 34 positions shown, 14 images · non-contrast
Comparison: none

CLINICAL DATA: Previous lumbar decompression and fusion. Low back
pain and left leg pain. Neck pain and occipital headache.
TECHNIQUE: Contiguous axial images were obtained through the Cervical,
Thoracic, and Lumbar spine after the intrathecal infusion of
infusion. Coronal and sagittal reconstructions were obtained of the
axial image sets.

[Series 2: l spine bone · axial · 0.27mm/px · z∈[-53,+47]mm · 2 of 120 slices shown, 3 images]
[im 40/120  soft-tissue]
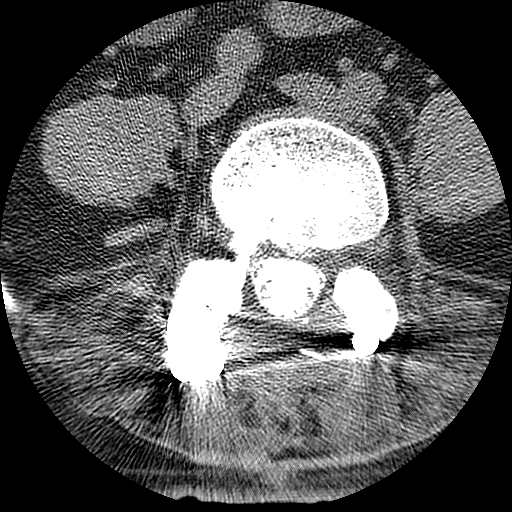
[im 40/120  bone]
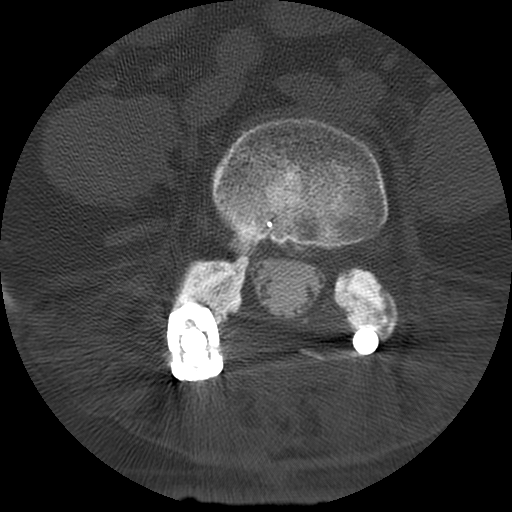
[im 80/120  bone]
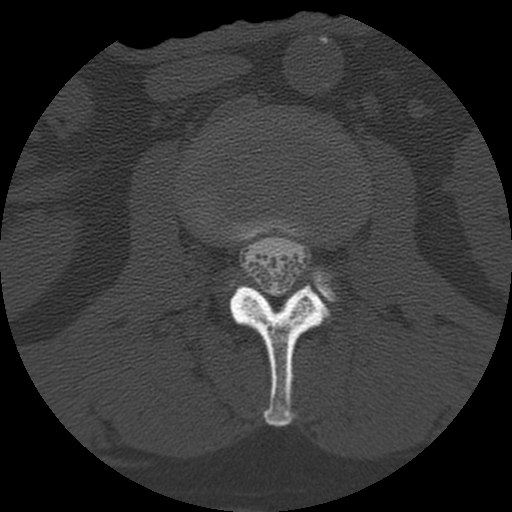

[Series 3: l spine soft · axial · 0.27mm/px · z∈[-78,+72]mm · 3 of 120 slices shown]
[im 30/120  soft-tissue]
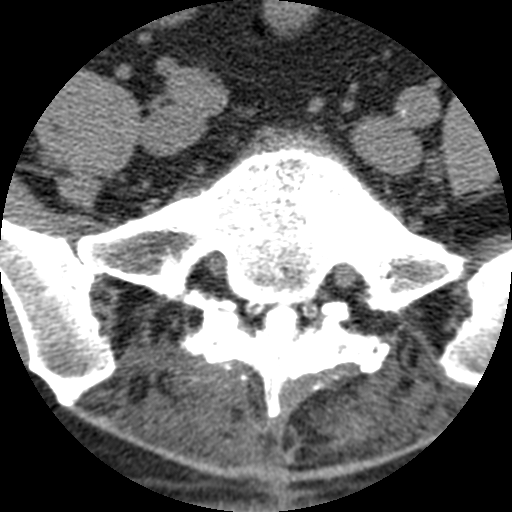
[im 60/120  soft-tissue]
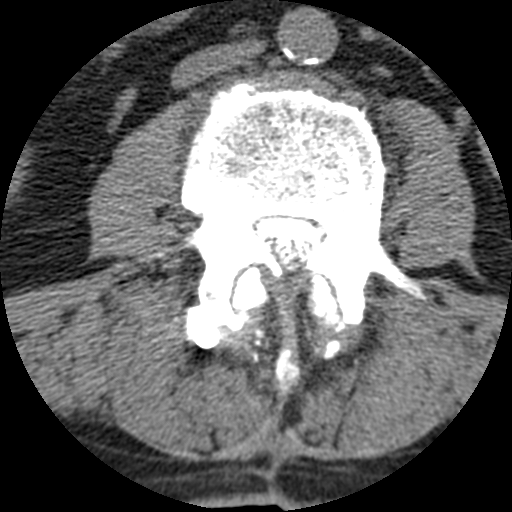
[im 90/120  soft-tissue]
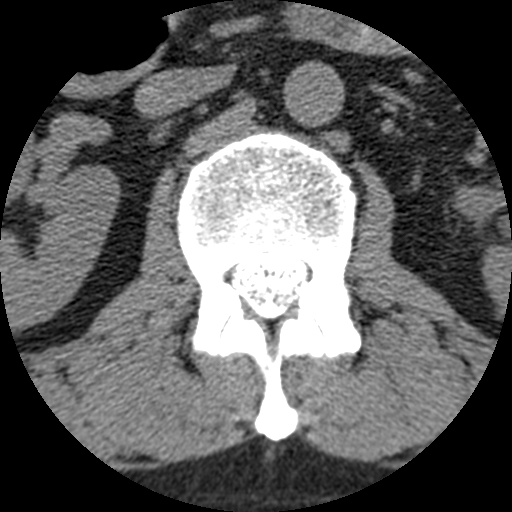

[Series 400: sag upper · coronal · 0.59mm/px · 5 of 50 slices shown]
[im 2/50  bone]
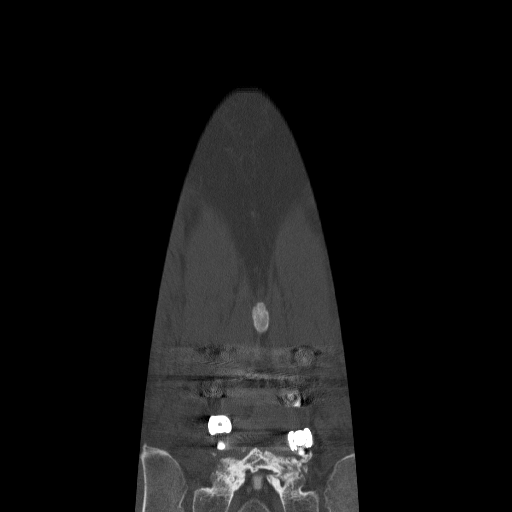
[im 12/50  bone]
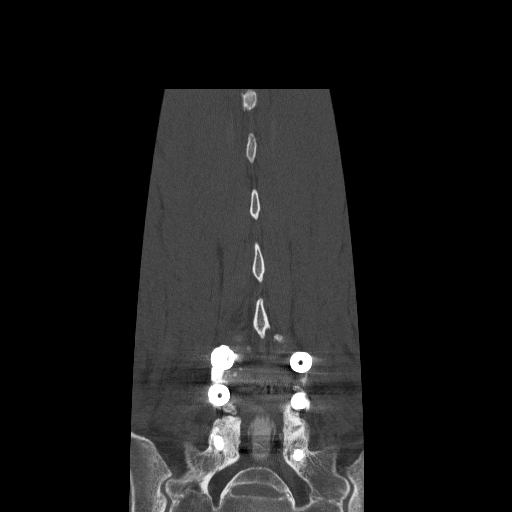
[im 23/50  bone]
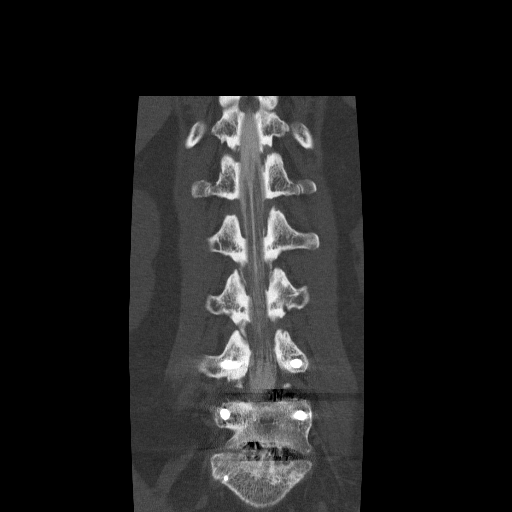
[im 34/50  bone]
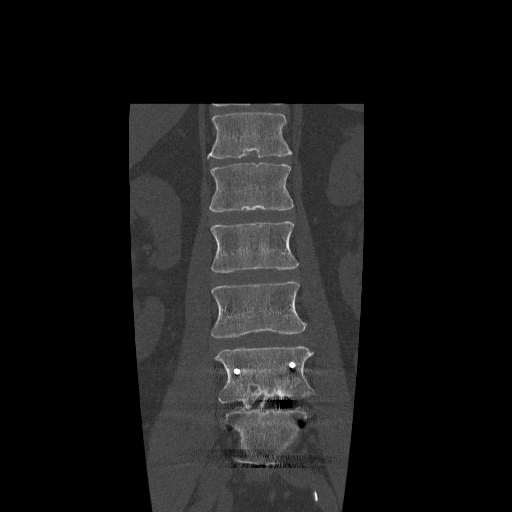
[im 45/50  bone]
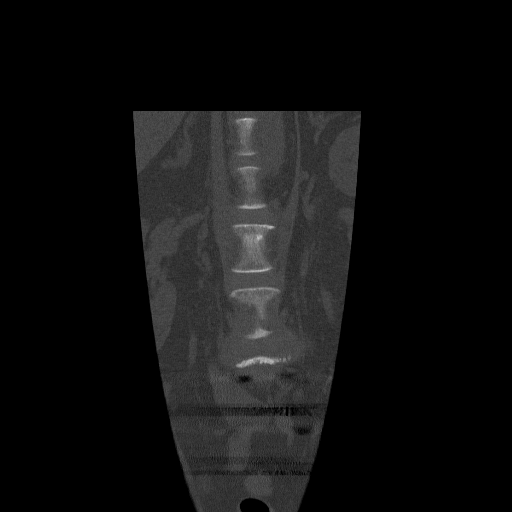

[Series 401: sag lower · coronal · 0.59mm/px · 3 of 55 slices shown]
[im 11/55  bone]
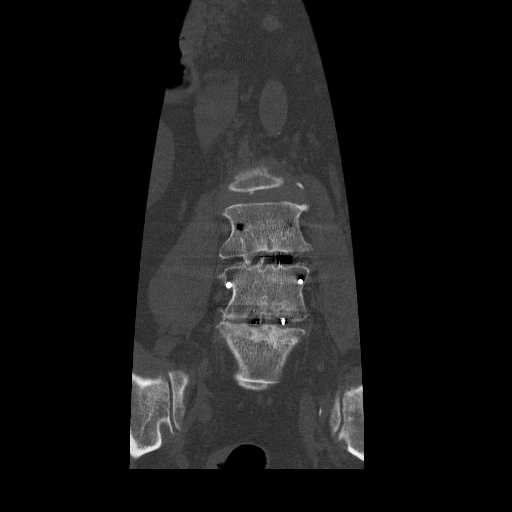
[im 22/55  bone]
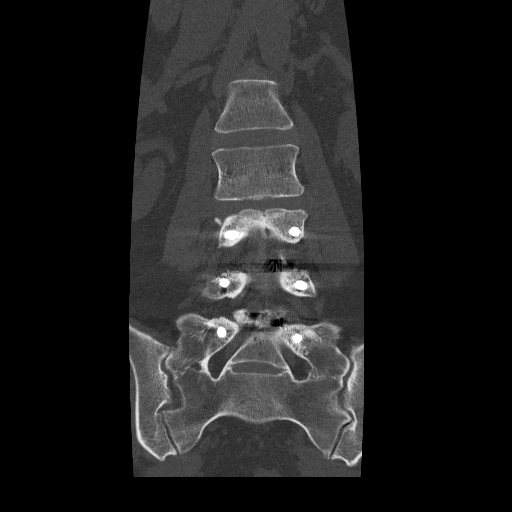
[im 33/55  bone]
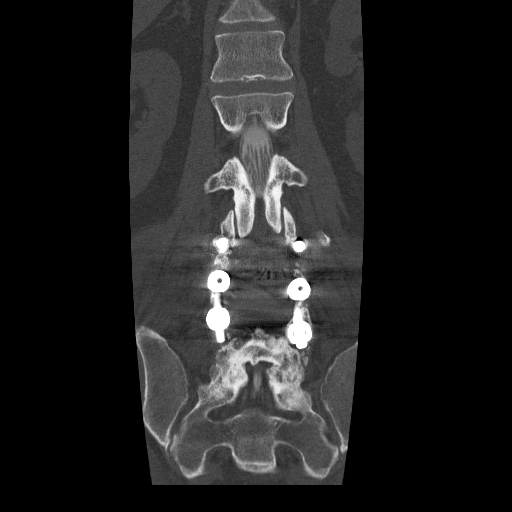

[13 of 34 positions shown; findings below may reference images not displayed]

FLUOROSCOPY TIME:  1 min 28 seconds

PROCEDURE:
LUMBAR PUNCTURE FOR CERVICAL LUMBAR  MYELOGRAM

CERVICAL AND LUMBAR MYELOGRAM

CT CERVICAL MYELOGRAM

CT LUMBAR MYELOGRAM

After thorough discussion of risks and benefits of the procedure
including bleeding, infection, injury to nerves, blood vessels,
adjacent structures as well as headache and CSF leak, written and
oral informed consent was obtained. Consent was obtained by Dr. Nizthar
Es.

Patient was positioned prone on the fluoroscopy table. Local
anesthesia was provided with 1% lidocaine without epinephrine after
prepped and draped in the usual sterile fashion. Puncture was
performed at L3-4 using a 3 1/2 inch 22-gauge spinal needle via
right approach. Using a single pass through the dura, the needle was
placed within the thecal sac, with return of clear CSF. 10 mL
Emnipaque-3GG was injected into the thecal sac, with normal
opacification of the nerve roots and cauda equina consistent with
free flow within the subarachnoid space. The patient was then moved
to the trendelenburg position and contrast flowed into the Thoracic
and Cervical spine regions.

I personally performed the lumbar puncture and administered the
intrathecal contrast. I also personally performed acquisition of the
myelogram images.
FINDINGS: CERVICAL AND LUMBAR MYELOGRAM FINDINGS:

Same numbering terminology use does on the previous study, with S1
being transitional.

There has been previous decompression and fusion from L4-S1. There
is a small anterior extradural defect at L3-4. No apparent neural
compression. There is wide patency of the canal in the fusion
segment from L4 to the sacrum. No evidence of hardware complication.
Standing flexion extension views show slight rocking motion at the
L3-4 level.

In the cervical region, there has been previous anterior cervical
discectomy and fusion at C5-6. There is wide patency of the central
canal. No abnormality of root sleeve filling is demonstrated. There
is a small anterior extradural defect at C3-4. No motion occurs in
the cervical spine with flexion extension.

CT CERVICAL MYELOGRAM FINDINGS:

The foramen magnum is widely patent. One can appreciate partial
opacification of the sphenoid sinus, not fully evaluated. C1-2 shows
mild osteoarthritis but no stenosis.

C2-3:  Normal interspace.

C3-4: Mild bulging of the disc. Small uncovertebral osteophytes. No
central canal stenosis. Mild foraminal narrowing, not grossly
compressive.

C4-5: Small endplate osteophytes and mild bulging of the disc. No
compressive central canal stenosis. Foraminal narrowing left more
than right. There would be some potential to affect the left C5
nerve root.

C5-6: Previous discectomy and fusion. I think fusion is solid. No
stenosis.

C6-7: Minimal bulging of the disc.  No canal or foraminal stenosis.

C7-T1:  Normal interspace.

CT LUMBAR MYELOGRAM FINDINGS:

As noted previously, S1 is transitional. This is the same numbering
terminology use on the previous study.

T12-L1: Chronic left paracentral disc herniation indents the left
side of the thecal sac slightly but does not appear to cause neural
compression.

L1-2:  Normal interspace.  Conus tip at this level.

L2-3:  Normal interspace.

L3-4: Circumferential protrusion of the disc. Mild facet and
ligamentous hypertrophy. Stenosis of both lateral recesses that
could cause neural compression. This likely worsens with standing
and flexion.

L4-S1: Previous decompression, diskectomy and fusion. Fusion is
solid. There is sufficient patency of the canal and foramina. Nerve
root distribution is slightly peripheral suggesting some degree
arachnoiditis. No other complicating features.

S1-2: Transitional and unremarkable. Solid fusion anteriorly and
posteriorly at this level.

There is mild osteoarthritis of the sacroiliac joints.
IMPRESSION: Lumbar region: Previous diskectomy, decompression and fusion from
L4-S1 has a good appearance. Solid union. No stenosis. Some degree
of arachnoiditis.

Bilateral lateral recess stenosis at L3-4 because of circumferential
protrusion of the disc in combination with mild facet and
ligamentous hypertrophy. Abnormal rocking motion at this level at
flexion-extension myelography, at which time the lateral recess
stenosis could worsen.

Cervical region: Previous anterior cervical discectomy and fusion at
C5-6 has a good appearance.

Mild degenerative spondylosis at C3-4 and C4-5. No central canal
stenosis. Foraminal narrowing on the left at C4-5 that could
possibly affect the left C5 nerve root.

## 2016-01-16 ENCOUNTER — Telehealth (HOSPITAL_COMMUNITY): Payer: Self-pay | Admitting: *Deleted

## 2016-01-16 NOTE — Telephone Encounter (Signed)
lmtcb to resch appt for June 23 due to office being closed. Number provided

## 2016-01-29 ENCOUNTER — Telehealth (HOSPITAL_COMMUNITY): Payer: Self-pay | Admitting: *Deleted

## 2016-02-11 ENCOUNTER — Other Ambulatory Visit: Payer: Self-pay | Admitting: Gastroenterology

## 2016-02-11 DIAGNOSIS — R1310 Dysphagia, unspecified: Secondary | ICD-10-CM

## 2016-02-19 ENCOUNTER — Telehealth (HOSPITAL_COMMUNITY): Payer: Self-pay | Admitting: *Deleted

## 2016-02-19 NOTE — Telephone Encounter (Signed)
left voice message regarding cancelled appointment for 02/20/16.

## 2016-02-20 ENCOUNTER — Ambulatory Visit (HOSPITAL_COMMUNITY): Payer: 59 | Admitting: Psychiatry

## 2016-03-01 ENCOUNTER — Ambulatory Visit
Admission: RE | Admit: 2016-03-01 | Discharge: 2016-03-01 | Disposition: A | Discharge status: Home / Self Care | Payer: 59 | Source: Ambulatory Visit | Attending: Gastroenterology | Admitting: Gastroenterology

## 2016-03-01 ENCOUNTER — Telehealth (HOSPITAL_COMMUNITY): Payer: Self-pay | Admitting: *Deleted

## 2016-03-01 DIAGNOSIS — R1310 Dysphagia, unspecified: Secondary | ICD-10-CM

## 2016-03-01 NOTE — Telephone Encounter (Signed)
returned phone call regarding an appointment. 

## 2016-04-05 ENCOUNTER — Encounter (HOSPITAL_COMMUNITY): Payer: Self-pay | Admitting: Psychiatry

## 2016-04-05 ENCOUNTER — Ambulatory Visit (INDEPENDENT_AMBULATORY_CARE_PROVIDER_SITE_OTHER): Payer: 59 | Admitting: Psychiatry

## 2016-04-05 VITALS — BP 105/66 | HR 103 | Ht 66.0 in | Wt 181.4 lb

## 2016-04-05 DIAGNOSIS — F322 Major depressive disorder, single episode, severe without psychotic features: Secondary | ICD-10-CM

## 2016-04-05 MED ORDER — TRAZODONE HCL 150 MG PO TABS: 150.0000 mg | ORAL_TABLET | Freq: Every day | ORAL | 2 refills | Status: DC

## 2016-04-05 MED ORDER — DULOXETINE HCL 60 MG PO CPEP: 60.0000 mg | ORAL_CAPSULE | Freq: Two times a day (BID) | ORAL | 2 refills | Status: DC
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
## 2016-06-03 IMAGING — US US BREAST BX W LOC DEV 1ST LESION IMG BX SPEC US GUIDE*L*
1 series · 11 of 11 positions shown · non-contrast
Comparison: 12/13/2013 and prior mammograms dating back to
08/05/2008.

ADDENDUM:
Final pathology demonstrates benign adipose tissue without atypia or
malignancy.

Histology is slightly unusual given mammographic findings but the
sonographic abnormality was extremely soft during the biopsy and
multiple large core samples were obtained from the sonographic
abnormality. Clip position is also directly in the middle of the
mammographic finding.
The patient was contacted by phone on 01/10/2015 and these results
given to her which she understood. Her questions were answered.
The patient had no complaints with her biopsy site...
Recommend left diagnostic mammogram with possible left breast
ultrasound in 6 months..
CLINICAL DATA: 49-year-old female for tissue sampling of hypoechoic
mass in the lower inner left breast.
EXAM:
ULTRASOUND GUIDED LEFT BREAST CORE NEEDLE BIOPSY WITH VACUUM ASSIST

[Series 1: us breast bx w loc dev 1st lesion img bx spec us g · 0.05mm/px · 11 of 11 slices shown]
[im 1/11]
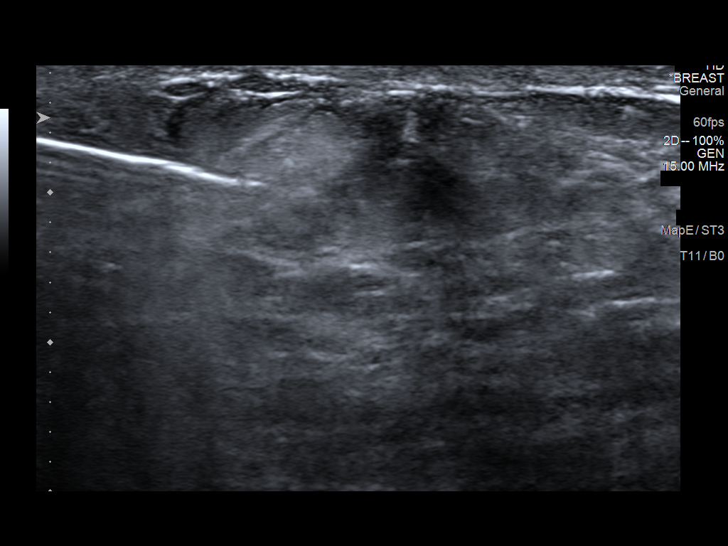
[im 2/11]
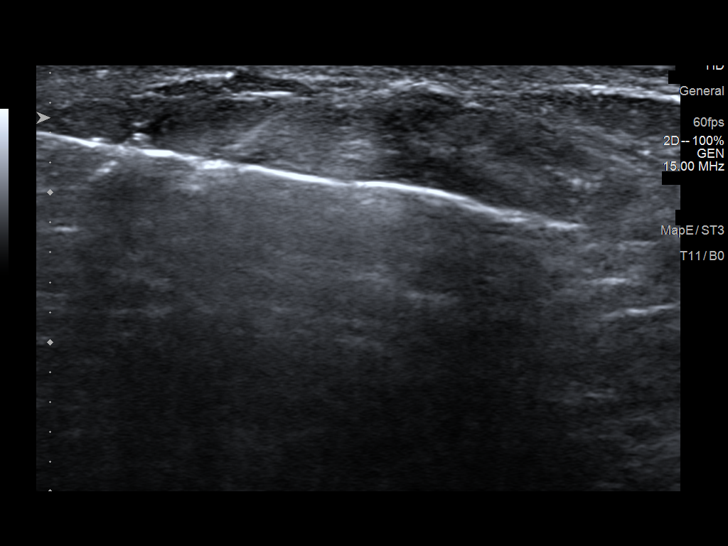
[im 3/11]
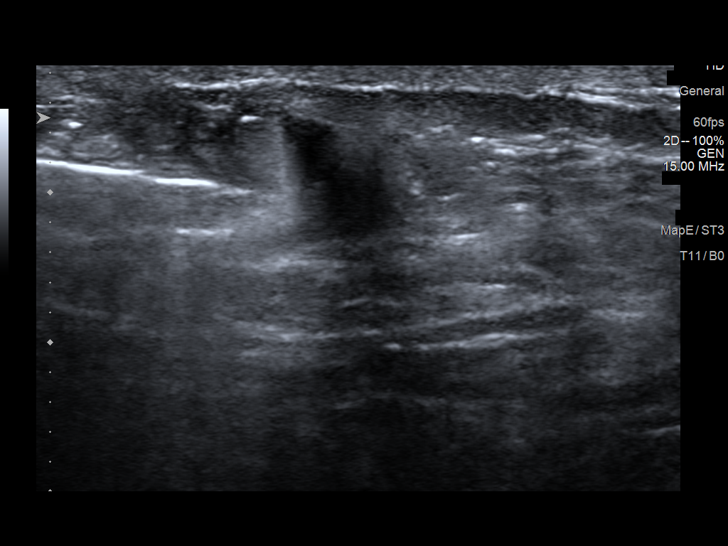
[im 4/11]
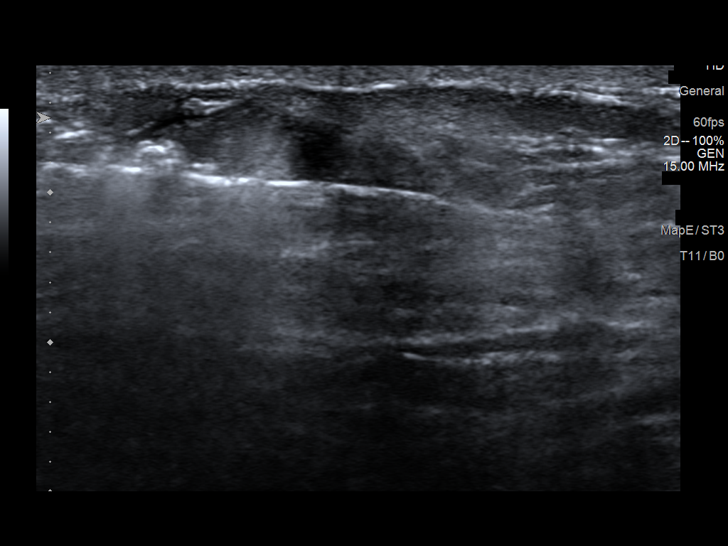
[im 5/11]
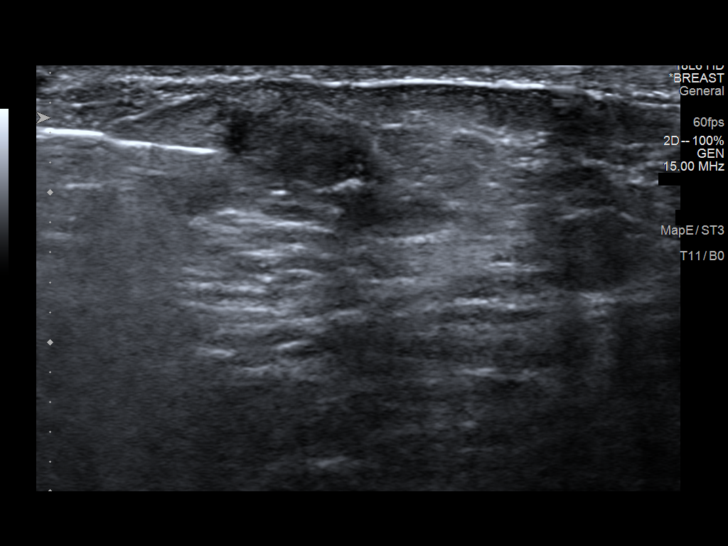
[im 6/11]
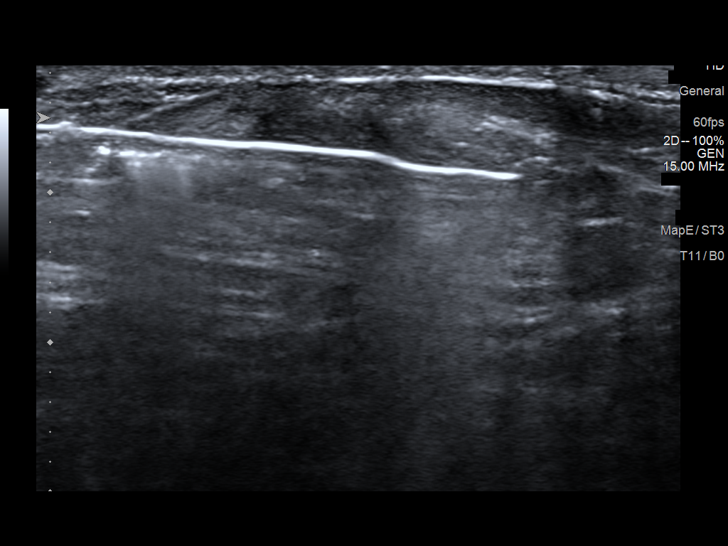
[im 7/11]
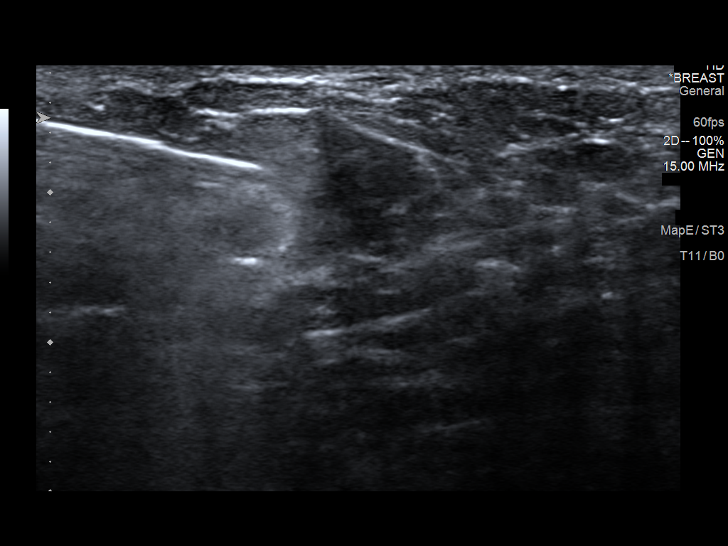
[im 8/11]
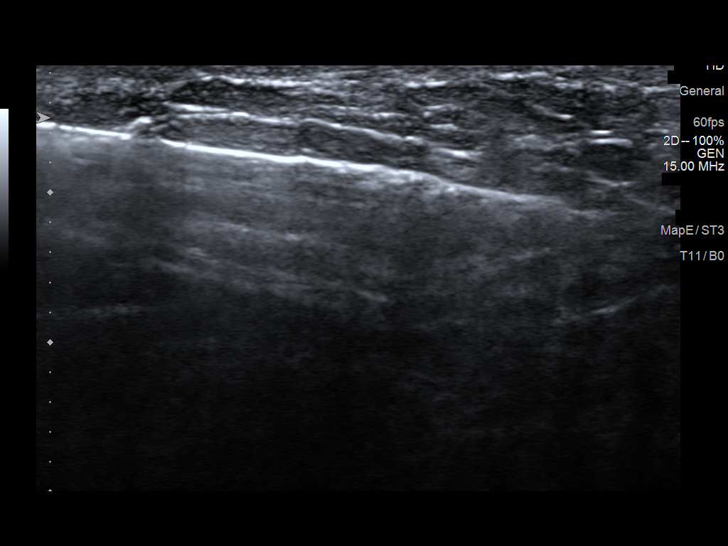
[im 9/11]
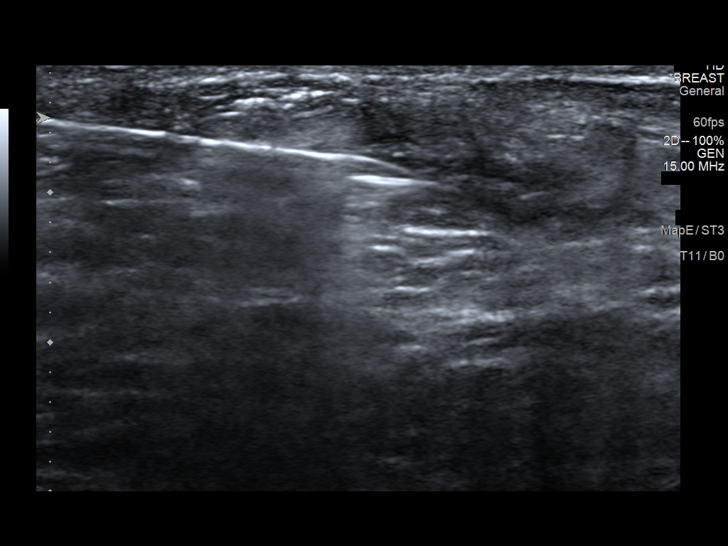
[im 10/11]
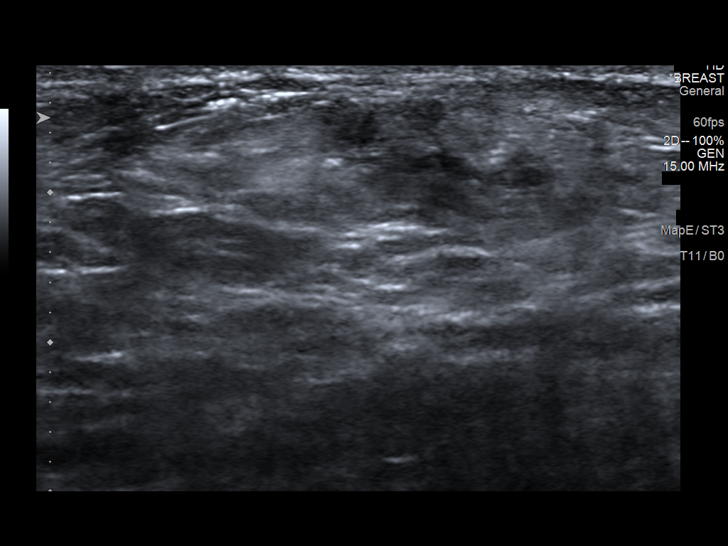
[im 11/11]
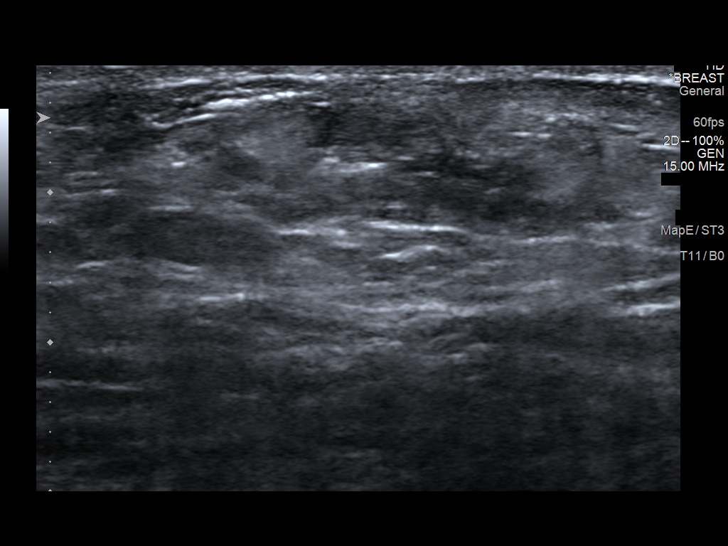

[11 of 11 positions shown; findings below may reference images not displayed]

PROCEDURE:
I met with the patient and we discussed the procedure of
ultrasound-guided biopsy, including benefits and alternatives. We
discussed the high likelihood of a successful procedure. We
discussed the risks of the procedure including infection, bleeding,
tissue injury, clip migration, and inadequate sampling. Informed
written consent was given. The usual time-out protocol was performed
immediately prior to the procedure.

Using sterile technique and 2% Lidocaine as local anesthetic, under
direct ultrasound visualization, a 12 gauge vacuum-assisted device
was used to perform biopsy of the 1.4 x 0.5 x 2.1 cm hypoechoic area
at the 7 o'clock position of the left breast 2 cm from the nipple
using a medial approach. At the conclusion of the procedure, a coil
shaped tissue marker clip was deployed into the biopsy cavity.
Follow-up 2-view mammogram was performed and dictated separately.
IMPRESSION: Ultrasound-guided biopsy of 2.1 cm hypoechoic area within the lower
inner left breast. No apparent complications.

Pathology will be followed.

## 2016-08-04 ENCOUNTER — Telehealth (HOSPITAL_COMMUNITY): Payer: Self-pay | Admitting: *Deleted

## 2016-08-04 NOTE — Telephone Encounter (Signed)
patient canceled appointment for 08/05/16 due to no gas money to get here.   Asked to reschedule after the holidays, in January.

## 2016-08-05 ENCOUNTER — Ambulatory Visit (HOSPITAL_COMMUNITY): Payer: 59 | Admitting: Psychiatry

## 2016-08-19 ENCOUNTER — Telehealth (HOSPITAL_COMMUNITY): Payer: Self-pay | Admitting: *Deleted

## 2016-08-19 NOTE — Telephone Encounter (Signed)
patient canceled appointment for 09/07/16, due to she has an appointment with pain clinic same time.   She said she could not come on earlier times offered due to she has to take her child to school.

## 2016-09-07 ENCOUNTER — Ambulatory Visit (HOSPITAL_COMMUNITY): Payer: 59 | Admitting: Psychiatry

## 2016-09-14 ENCOUNTER — Ambulatory Visit (INDEPENDENT_AMBULATORY_CARE_PROVIDER_SITE_OTHER): Payer: 59 | Admitting: Psychiatry

## 2016-09-14 ENCOUNTER — Encounter (HOSPITAL_COMMUNITY): Payer: Self-pay | Admitting: Psychiatry

## 2016-09-14 VITALS — BP 123/76 | HR 100

## 2016-09-14 DIAGNOSIS — Z79891 Long term (current) use of opiate analgesic: Secondary | ICD-10-CM | POA: Diagnosis not present

## 2016-09-14 DIAGNOSIS — F322 Major depressive disorder, single episode, severe without psychotic features: Secondary | ICD-10-CM | POA: Diagnosis not present

## 2016-09-14 DIAGNOSIS — Z888 Allergy status to other drugs, medicaments and biological substances status: Secondary | ICD-10-CM | POA: Diagnosis not present

## 2016-09-14 DIAGNOSIS — Z79899 Other long term (current) drug therapy: Secondary | ICD-10-CM | POA: Diagnosis not present

## 2016-09-14 DIAGNOSIS — Z818 Family history of other mental and behavioral disorders: Secondary | ICD-10-CM

## 2016-09-14 MED ORDER — TRAZODONE HCL 150 MG PO TABS: 150.0000 mg | ORAL_TABLET | Freq: Every day | ORAL | 2 refills | Status: DC

## 2016-09-14 MED ORDER — DULOXETINE HCL 60 MG PO CPEP: 60.0000 mg | ORAL_CAPSULE | Freq: Two times a day (BID) | ORAL | 2 refills | Status: DC
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
## 2016-09-21 ENCOUNTER — Telehealth (HOSPITAL_COMMUNITY): Payer: Self-pay | Admitting: *Deleted

## 2016-09-21 NOTE — Telephone Encounter (Signed)
Prior authorization for Duloxetine received. Called [phone] spoke with [employer] who gave approval until 09/21/17 approval #[id number]. Called to notify pharmacy.

## 2016-09-21 NOTE — Telephone Encounter (Signed)
Called pt and lmtcb and to go to pharmacy to pick up medication.

## 2016-09-22 ENCOUNTER — Telehealth (HOSPITAL_COMMUNITY): Payer: Self-pay | Admitting: *Deleted

## 2016-09-22 NOTE — Telephone Encounter (Signed)
voice message from [phone], stated PA for doseage needed for 180, 90 day supply.   PA approved did not go through.

## 2016-09-23 ENCOUNTER — Telehealth (HOSPITAL_COMMUNITY): Payer: Self-pay | Admitting: *Deleted

## 2016-09-23 NOTE — Telephone Encounter (Signed)
Pharmacy called earlier stating medication Duloxetine was not being approved by insurance. This Clinical research associatewriter contacted Monia Pouchetna to find reason, was told that it was processed later today and it was approved. No further information needed.

## 2016-09-23 NOTE — Telephone Encounter (Signed)
Called pt pharmacy to get more information as to why they are unable to refill pt medication. Spoke with Vonna KotykJay. Per Vonna KotykJay they are trying to run pt medication with insurance but the insurance is only approving a quantity of 1 instead of 2 a day. Per Vonna KotykJay he will call the insurance and see what's happening and will call office back.

## 2016-09-23 NOTE — Telephone Encounter (Signed)
Called pt pharmacy to get more information as to why they are unable to refill pt medication. Spoke with Vonna KotykJay. Per Vonna KotykJay they are trying to run pt medication with insurance but the insurance is only approving a quantity of 1 instead of 2 a day. Per Vonna KotykJay he will call the insurance and see what's happening and will call office back. Could you please call insurance and see what office/pharmacy have to do to get pt medication filled? Office provided pharmacy with approval number and Vonna KotykJay the pharmacist verbalized understanding.

## 2016-09-27 NOTE — Telephone Encounter (Signed)
noted 

## 2016-09-29 ENCOUNTER — Telehealth (HOSPITAL_COMMUNITY): Payer: Self-pay | Admitting: *Deleted

## 2016-09-29 NOTE — Telephone Encounter (Signed)
Office received [place] from [employer] stating pt was approved for her Duloxetine from 09-21-2016 until 09-21-2017

## 2016-09-30 ENCOUNTER — Other Ambulatory Visit: Payer: Self-pay | Admitting: Obstetrics and Gynecology

## 2016-09-30 DIAGNOSIS — Z1231 Encounter for screening mammogram for malignant neoplasm of breast: Secondary | ICD-10-CM

## 2016-10-01 ENCOUNTER — Telehealth (HOSPITAL_COMMUNITY): Payer: Self-pay | Admitting: *Deleted

## 2016-10-01 NOTE — Telephone Encounter (Signed)
Office received prior approval for pt Duloxetine that's covered from 09-21-2016 until 09-21-2017 from [employer].

## 2016-10-06 ENCOUNTER — Ambulatory Visit: Payer: Self-pay

## 2016-10-12 ENCOUNTER — Ambulatory Visit (INDEPENDENT_AMBULATORY_CARE_PROVIDER_SITE_OTHER): Payer: 59 | Admitting: Psychiatry

## 2016-10-12 ENCOUNTER — Encounter (HOSPITAL_COMMUNITY): Payer: Self-pay | Admitting: Psychiatry

## 2016-10-12 VITALS — BP 90/54 | HR 107 | Ht 66.0 in | Wt 176.2 lb

## 2016-10-12 DIAGNOSIS — Z888 Allergy status to other drugs, medicaments and biological substances status: Secondary | ICD-10-CM

## 2016-10-12 DIAGNOSIS — Z79899 Other long term (current) drug therapy: Secondary | ICD-10-CM | POA: Diagnosis not present

## 2016-10-12 DIAGNOSIS — F322 Major depressive disorder, single episode, severe without psychotic features: Secondary | ICD-10-CM | POA: Diagnosis not present

## 2016-10-12 DIAGNOSIS — Z79891 Long term (current) use of opiate analgesic: Secondary | ICD-10-CM | POA: Diagnosis not present

## 2016-10-12 DIAGNOSIS — Z818 Family history of other mental and behavioral disorders: Secondary | ICD-10-CM

## 2016-10-12 MED ORDER — DULOXETINE HCL 60 MG PO CPEP: 60.0000 mg | ORAL_CAPSULE | Freq: Two times a day (BID) | ORAL | 2 refills | Status: DC

## 2016-10-12 MED ORDER — TRAZODONE HCL 150 MG PO TABS: 150.0000 mg | ORAL_TABLET | Freq: Every day | ORAL | 2 refills | Status: DC
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
## 2016-10-13 ENCOUNTER — Ambulatory Visit: Payer: Self-pay

## 2016-11-16 ENCOUNTER — Ambulatory Visit: Payer: Self-pay

## 2016-12-03 ENCOUNTER — Ambulatory Visit: Payer: Self-pay

## 2017-01-07 ENCOUNTER — Ambulatory Visit (INDEPENDENT_AMBULATORY_CARE_PROVIDER_SITE_OTHER): Payer: 59 | Admitting: Psychiatry

## 2017-01-07 ENCOUNTER — Encounter (HOSPITAL_COMMUNITY): Payer: Self-pay | Admitting: Psychiatry

## 2017-01-07 VITALS — BP 100/60 | Ht 66.0 in | Wt 175.0 lb

## 2017-01-07 DIAGNOSIS — Z79899 Other long term (current) drug therapy: Secondary | ICD-10-CM

## 2017-01-07 DIAGNOSIS — Z79891 Long term (current) use of opiate analgesic: Secondary | ICD-10-CM | POA: Diagnosis not present

## 2017-01-07 DIAGNOSIS — Z818 Family history of other mental and behavioral disorders: Secondary | ICD-10-CM | POA: Diagnosis not present

## 2017-01-07 DIAGNOSIS — F322 Major depressive disorder, single episode, severe without psychotic features: Secondary | ICD-10-CM | POA: Diagnosis not present

## 2017-01-07 MED ORDER — TRAZODONE HCL 150 MG PO TABS: 150.0000 mg | ORAL_TABLET | Freq: Every day | ORAL | 2 refills | Status: DC

## 2017-01-07 MED ORDER — DULOXETINE HCL 60 MG PO CPEP: 60.0000 mg | ORAL_CAPSULE | Freq: Two times a day (BID) | ORAL | 2 refills | Status: DC
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
## 2017-01-27 ENCOUNTER — Ambulatory Visit: Payer: Self-pay

## 2017-02-10 ENCOUNTER — Ambulatory Visit
Admission: RE | Admit: 2017-02-10 | Discharge: 2017-02-10 | Disposition: A | Discharge status: Home / Self Care | Payer: 59 | Source: Ambulatory Visit | Attending: Obstetrics and Gynecology | Admitting: Obstetrics and Gynecology

## 2017-02-10 DIAGNOSIS — Z1231 Encounter for screening mammogram for malignant neoplasm of breast: Secondary | ICD-10-CM

## 2017-02-10 LAB — BASIC METABOLIC PANEL
BUN: 15 (ref 4–21)
CREATININE: 0.6 (ref <=1.1)

## 2017-02-10 LAB — LIPID PANEL
Cholesterol: 293 — AB (ref 0–200)
HDL: 50 (ref 35–70)
TRIGLYCERIDES: 404 — AB (ref 40–160)

## 2017-02-10 LAB — HEMOGLOBIN A1C: Hemoglobin A1C: 15.5

## 2017-02-10 LAB — TSH: TSH: 1.3 (ref <=5.90)

## 2017-02-12 ENCOUNTER — Emergency Department (HOSPITAL_COMMUNITY)
Admission: EM | Admit: 2017-02-12 | Discharge: 2017-02-13 | Disposition: A | Discharge status: Home / Self Care | Payer: 59 | Attending: Emergency Medicine | Admitting: Emergency Medicine

## 2017-02-12 DIAGNOSIS — Z79899 Other long term (current) drug therapy: Secondary | ICD-10-CM | POA: Diagnosis not present

## 2017-02-12 DIAGNOSIS — E1165 Type 2 diabetes mellitus with hyperglycemia: Secondary | ICD-10-CM | POA: Diagnosis not present

## 2017-02-12 DIAGNOSIS — R739 Hyperglycemia, unspecified: Secondary | ICD-10-CM

## 2017-02-12 LAB — URINALYSIS, ROUTINE W REFLEX MICROSCOPIC
Bacteria, UA: NONE SEEN
Bilirubin Urine: NEGATIVE
Glucose, UA: >=500 mg/dL — AB
Hgb urine dipstick: NEGATIVE
Ketones, ur: 5 mg/dL — AB
Leukocytes, UA: NEGATIVE
Nitrite: NEGATIVE
Protein, ur: NEGATIVE mg/dL
Specific Gravity, Urine: 1.025 (ref 1.005–1.030)
Squamous Epithelial / LPF: NONE SEEN
pH: 5 (ref 5.0–8.0)

## 2017-02-12 LAB — CBG MONITORING, ED: Glucose-Capillary: >600 mg/dL (ref 65–99)

## 2017-02-12 MED ORDER — INSULIN ASPART 100 UNIT/ML IV SOLN
12.0000 [IU] | Freq: Once | INTRAVENOUS | Status: AC
  Administered 2017-02-13: 12 [IU] via INTRAVENOUS

## 2017-02-12 MED ORDER — SODIUM CHLORIDE 0.9 % IV BOLUS (SEPSIS)
2000.0000 mL | Freq: Once | INTRAVENOUS | Status: AC
  Administered 2017-02-12: 2000 mL via INTRAVENOUS

## 2017-02-12 NOTE — ED Provider Notes (Signed)
[facility] DEPT Provider Note   CSN: [id number] Arrival date & time: 02/12/17  2224 By signing my name below, I, Levon HedgerElizabeth Hall, attest that this documentation has been prepared under the direction and in the presence of Raeford RazorKohut, Sakia Schrimpf, MD . Electronically Signed: Levon HedgerElizabeth Hall, Scribe. 02/12/2017. 11:06 PM.   History   Chief Complaint Chief Complaint  Patient presents with  . Hyperglycemia    HPI Victoria Mcdowell is a 51 y.o. female who presents to the Emergency Department complaining of hyperglycemia with unknown onset. Pt had labs drawn yesterday at her OB-GYN and received a call tonight stating that her CBG was over 500 and A1 was greater than 15. Pt states she ha not taken her Metformin and insulin in about a year due to financial difficulties. She reports associated polyuria, polydipsia, nausea and abdominal pain. She also endorses some blurred vision, but states this is typical for her due to her other medications. Pt has no other acute complaints or associated symptoms at this time.    The history is provided by the patient. No language interpreter was used.    Past Medical History:  Diagnosis Date  . Depression   . Diabetes mellitus, type II (HCC)   . Elevated cholesterol     There are no active problems to display for this patient.   Past Surgical History:  Procedure Laterality Date  . BREAST BIOPSY Left 2016  . CARPAL TUNNEL RELEASE    . KNEE SURGERY    . SPINE SURGERY    . ulner nerve release      OB History    No data available       Home Medications    Prior to Admission medications   Medication Sig Start Date End Date Taking? Authorizing Provider  baclofen (LIORESAL) 10 MG tablet Take 10 mg by mouth as needed for muscle spasms.    [provider]  benzonatate (TESSALON) 100 MG capsule Take 100-200 mg by mouth 3 (three) times daily as needed for cough.    [provider]  canagliflozin (INVOKANA) 300 MG TABS tablet Take 300 mg by  mouth daily before breakfast.    [provider]  DULoxetine (CYMBALTA) 60 MG capsule Take 1 capsule (60 mg total) by mouth 2 (two) times daily. 01/07/17 01/07/18  Myrlene Brokeross, Deborah R, MD  gabapentin (NEURONTIN) 600 MG tablet Take 600 mg by mouth 3 (three) times daily.    [provider]  metFORMIN (GLUCOPHAGE) 500 MG tablet Take 500 mg by mouth 2 (two) times daily with a meal.    [provider]  NOVOLIN 70/30 RELION (70-30) 100 UNIT/ML injection Taking 40 units in AM and 30 in PM 09/13/14   [provider]  oxyCODONE-acetaminophen (PERCOCET) 10-325 MG per tablet Take 1 tablet by mouth every 6 (six) hours as needed for pain.    [provider]  simvastatin (ZOCOR) 40 MG tablet Take 40 mg by mouth daily.    [provider]  traZODone (DESYREL) 150 MG tablet Take 1 tablet (150 mg total) by mouth at bedtime. 01/07/17   Myrlene Brokeross, Deborah R, MD    Family History Family History  Problem Relation Age of Onset  . Depression Mother   . Depression Father   . Breast cancer Neg Hx     Social History Social History  Substance Use Topics  . Smoking status: Never Smoker  . Smokeless tobacco: Never Used  . Alcohol use No     Allergies   Biaxin [  clarithromycin]   Review of Systems Review of Systems  Endocrine: Positive for polydipsia and polyuria.  All other systems reviewed and are negative.  Physical Exam Updated Vital Signs BP (!) 162/90 (BP Location: Left Arm)   Pulse (!) 107   Temp 98.3 F (36.8 C) (Oral)   Resp 18   Ht 5\' 7"  (1.702 m)   Wt 177 lb (80.3 kg)   LMP 12/25/2014   SpO2 94%   BMI 27.72 kg/m   Physical Exam  Constitutional: She is oriented to person, place, and time. She appears well-developed and well-nourished. No distress.  HENT:  Head: Normocephalic and atraumatic.  Eyes: EOM are normal.  Neck: Normal range of motion.  Cardiovascular: Regular rhythm and normal heart sounds.  Tachycardia present.   Pulmonary/Chest:  Effort normal and breath sounds normal.  Abdominal: Soft. She exhibits no distension. There is no tenderness.  Musculoskeletal: Normal range of motion.  Neurological: She is alert and oriented to person, place, and time.  Skin: Skin is warm and dry.  Psychiatric: She has a normal mood and affect. Judgment normal.  Nursing note and vitals reviewed.   ED Treatments / Results  DIAGNOSTIC STUDIES:  Oxygen Saturation is 94% on RA, adequate by my interpretation.    COORDINATION OF CARE:  10:50 PM Discussed treatment plan with pt at bedside and pt agreed to plan.  Labs (all labs ordered are listed, but only abnormal results are displayed) Labs Reviewed  CBG MONITORING, ED - Abnormal; Notable for the following:       Result Value   Glucose-Capillary >600 (*)    All other components within normal limits    EKG  EKG Interpretation None       Radiology No results found.  Procedures Procedures (including critical care time)  Medications Ordered in ED Medications - No data to display   Initial Impression / Assessment and Plan / ED Course  I have reviewed the triage vital signs and the nursing notes.  Pertinent labs & imaging results that were available during my care of the patient were reviewed by me and considered in my medical decision making (see chart for details).     51yF with hyperglycemia. Does have polyuria/polydipsia and mild tachycardia but otherwise really no complaints. Based on her A1C she has been very hyperglycemic for quite some time. Her care was signed out with blood work pending. Unless showing significant derangement aside from her hyperglycemia that would necessitate admission, she would like to go home. I don't think that this is unreasonable.  Based on review of records, she was previously on metformin 500mg  bid, invokana 300mg  daily and 70/30 insulin 40u in moring and 30u in evening. It has been over a year since she was on meds. Will restart insulin  at 20u BID for now. Other meds as previously prescribed. Needs to follow-up closely for further management. She says she has means to afford medications now. Will also provide with resource list.   Final Clinical Impressions(s) / ED Diagnoses   Final diagnoses:  Hyperglycemia    New Prescriptions New Prescriptions   No medications on file   I personally preformed the services scribed in my presence. The recorded information has been reviewed is accurate. Raeford Razor, MD.    Raeford Razor, MD 02/13/17 [id number]

## 2017-02-12 NOTE — ED Triage Notes (Signed)
Pt [place] tonight due to blood sugars of over 500 with an A1C of over 15  Pt reports that she has had no insulin or other meds in over one year.

## 2017-02-13 LAB — CBC WITH DIFFERENTIAL/PLATELET
Basophils Absolute: 0 10*3/uL (ref 0.0–0.1)
Basophils Relative: 0 %
Eosinophils Absolute: 0.1 10*3/uL (ref 0.0–0.7)
Eosinophils Relative: 1 %
HCT: 39.7 % (ref 36.0–46.0)
Hemoglobin: 13.6 g/dL (ref 12.0–15.0)
Lymphocytes Relative: 45 %
Lymphs Abs: 2.6 10*3/uL (ref 0.7–4.0)
MCH: 32.1 pg (ref 26.0–34.0)
MCHC: 34.3 g/dL (ref 30.0–36.0)
MCV: 93.6 fL (ref 78.0–100.0)
Monocytes Absolute: 0.2 10*3/uL (ref 0.1–1.0)
Monocytes Relative: 4 %
Neutro Abs: 2.8 10*3/uL (ref 1.7–7.7)
Neutrophils Relative %: 50 %
Platelets: 175 10*3/uL (ref 150–400)
RBC: 4.24 MIL/uL (ref 3.87–5.11)
RDW: 12.2 % (ref 11.5–15.5)
WBC: 5.7 10*3/uL (ref 4.0–10.5)

## 2017-02-13 LAB — CBG MONITORING, ED
GLUCOSE-CAPILLARY: 393 mg/dL — AB (ref 65–99)
GLUCOSE-CAPILLARY: 338 mg/dL — AB (ref 65–99)
Glucose-Capillary: 388 mg/dL — ABNORMAL HIGH (ref 65–99)
Glucose-Capillary: 385 mg/dL — ABNORMAL HIGH (ref 65–99)

## 2017-02-13 LAB — BASIC METABOLIC PANEL
Anion gap: 13 (ref 5–15)
BUN: 17 mg/dL (ref 6–20)
CO2: 24 mmol/L (ref 22–32)
Calcium: 9.2 mg/dL (ref 8.9–10.3)
Chloride: 101 mmol/L (ref 101–111)
Creatinine, Ser: 0.71 mg/dL (ref 0.44–1.00)
GFR calc Af Amer: >60 mL/min (ref >60)
GFR calc non Af Amer: >60 mL/min (ref >60)
Glucose, Bld: 519 mg/dL (ref 65–99)
Potassium: 3.4 mmol/L — ABNORMAL LOW (ref 3.5–5.1)
Sodium: 138 mmol/L (ref 135–145)

## 2017-02-13 MED ORDER — "INSULIN SYRINGE 28G X 1/2"" 0.5 ML MISC": 1.0000 [IU] | Freq: Two times a day (BID) | 5 refills | Status: DC

## 2017-02-13 MED ORDER — INSULIN NPH ISOPHANE & REGULAR (70-30) 100 UNIT/ML ~~LOC~~ SUSP: 20.0000 [IU] | Freq: Two times a day (BID) | SUBCUTANEOUS | 11 refills | Status: DC

## 2017-02-13 MED ORDER — CANAGLIFLOZIN 300 MG PO TABS: 300.0000 mg | ORAL_TABLET | Freq: Every day | ORAL | 0 refills | Status: DC

## 2017-02-13 MED ORDER — INSULIN ASPART 100 UNIT/ML IV SOLN
12.0000 [IU] | Freq: Once | INTRAVENOUS | Status: AC
  Administered 2017-02-13: 12 [IU] via INTRAVENOUS

## 2017-02-13 MED ORDER — METFORMIN HCL 500 MG PO TABS: 500.0000 mg | ORAL_TABLET | Freq: Two times a day (BID) | ORAL | 2 refills | Status: DC

## 2017-02-13 NOTE — ED Notes (Signed)
CRITICAL VALUE ALERT  Critical Value:  Glucose 519  Date & Time Notied:  02/13/2017 0053  Provider Notified: Dr Wilkie Ayehorton   Orders Received/Actions taken: none

## 2017-02-13 NOTE — ED Provider Notes (Signed)
Patient signed out pending lab work, hydration, and insulin. No evidence of DKA on lab work. She received a total of 3 L of fluid and 24 units of insulin. Repeat blood sugars down to 338. Given an A1c 15, feel she likely has been hyperglycemic for some time. She is nontoxic on recheck and fairly asymptomatic. Will reinitiate home hyperglycemic agents per Dr. Marylen PontoKohut's discharge instructions. She needs to monitor her blood sugars closely at home. She reports she has a glucometer. Follow-up closely with primary physician.  After history, exam, and medical workup I feel the patient has been appropriately medically screened and is safe for discharge home. Pertinent diagnoses were discussed with the patient. Patient was given return precautions.    Shon BatonHorton, Courtney F, MD 02/13/17 [phone]

## 2017-04-04 ENCOUNTER — Encounter: Payer: Self-pay | Admitting: "Endocrinology

## 2017-04-04 ENCOUNTER — Ambulatory Visit (INDEPENDENT_AMBULATORY_CARE_PROVIDER_SITE_OTHER): Payer: 59 | Admitting: "Endocrinology

## 2017-04-04 VITALS — BP 124/82 | HR 107 | Wt 189.0 lb

## 2017-04-04 DIAGNOSIS — E1165 Type 2 diabetes mellitus with hyperglycemia: Secondary | ICD-10-CM

## 2017-04-04 DIAGNOSIS — Z794 Long term (current) use of insulin: Secondary | ICD-10-CM

## 2017-04-04 DIAGNOSIS — E782 Mixed hyperlipidemia: Secondary | ICD-10-CM | POA: Diagnosis not present

## 2017-04-04 DIAGNOSIS — E118 Type 2 diabetes mellitus with unspecified complications: Secondary | ICD-10-CM | POA: Diagnosis not present

## 2017-04-04 DIAGNOSIS — IMO0002 Reserved for concepts with insufficient information to code with codable children: Secondary | ICD-10-CM | POA: Insufficient documentation

## 2017-04-04 DIAGNOSIS — E559 Vitamin D deficiency, unspecified: Secondary | ICD-10-CM | POA: Diagnosis not present

## 2017-04-04 MED ORDER — VITAMIN D3 125 MCG (5000 UT) PO CAPS: 5000.0000 [IU] | ORAL_CAPSULE | Freq: Every day | ORAL | 0 refills | Status: DC

## 2017-04-04 MED ORDER — CANAGLIFLOZIN 100 MG PO TABS: 100.0000 mg | ORAL_TABLET | Freq: Every day | ORAL | 2 refills | Status: DC

## 2017-04-04 MED ORDER — ONETOUCH VERIO W/DEVICE KIT: 1.0000 | PACK | 0 refills | Status: AC | PRN

## 2017-04-04 MED ORDER — METFORMIN HCL 500 MG PO TABS: 500.0000 mg | ORAL_TABLET | Freq: Two times a day (BID) | ORAL | 2 refills | Status: DC

## 2017-04-04 MED ORDER — GLUCOSE BLOOD VI STRP: ORAL_STRIP | 3 refills | Status: DC

## 2017-04-04 MED ORDER — ATORVASTATIN CALCIUM 20 MG PO TABS: 20.0000 mg | ORAL_TABLET | Freq: Every day | ORAL | 3 refills | Status: DC

## 2017-04-04 NOTE — Patient Instructions (Signed)

## 2017-04-04 NOTE — Progress Notes (Signed)
Subjective:    Patient ID: Victoria Mcdowell, female    DOB: Mar 20, 1966. Patient is being seen in consultation for management of diabetes requested by  Paula Compton, MD  Past Medical History:  Diagnosis Date  . Depression   . Diabetes mellitus, type II ([place])   . Elevated cholesterol    Past Surgical History:  Procedure Laterality Date  . BREAST BIOPSY Left 2016  . CARPAL TUNNEL RELEASE    . KNEE SURGERY    . SPINE SURGERY    . ulner nerve release     Social History   Social History  . Marital status: Married    Spouse name: N/A  . Number of children: N/A  . Years of education: N/A   Social History Main Topics  . Smoking status: Never Smoker  . Smokeless tobacco: Never Used  . Alcohol use No  . Drug use: No  . Sexual activity: Not Currently   Other Topics Concern  . None   Social History Narrative  . None   Outpatient Encounter Prescriptions as of 04/04/2017  Medication Sig  . baclofen (LIORESAL) 10 MG tablet Take 10 mg by mouth 3 (three) times daily as needed for muscle spasms.   . DULoxetine (CYMBALTA) 60 MG capsule Take 1 capsule (60 mg total) by mouth 2 (two) times daily.  Marland Kitchen gabapentin (NEURONTIN) 600 MG tablet Take 600 mg by mouth 3 (three) times daily.  . Insulin NPH Isophane & Regular (HUMULIN 70/30 [place]) Inject 20 Units into the skin 2 (two) times daily before a meal.  . INSULIN SYRINGE .5CC/28G 28G X 1/2" 0.5 ML MISC 1 Units by Does not apply route 2 (two) times daily.  . metFORMIN (GLUCOPHAGE) 500 MG tablet Take 1 tablet (500 mg total) by mouth 2 (two) times daily with a meal.  . oxyCODONE-acetaminophen (PERCOCET) 10-325 MG per tablet Take 1 tablet by mouth every 6 (six) hours as needed for pain.  . traZODone (DESYREL) 150 MG tablet Take 1 tablet (150 mg total) by mouth at bedtime.  . [DISCONTINUED] canagliflozin (INVOKANA) 300 MG TABS tablet Take 1 tablet (300 mg total) by mouth daily before breakfast.  . [DISCONTINUED] metFORMIN (GLUCOPHAGE) 500 MG tablet  Take 1 tablet (500 mg total) by mouth 2 (two) times daily with a meal.  . atorvastatin (LIPITOR) 20 MG tablet Take 1 tablet (20 mg total) by mouth daily.  . Blood Glucose Monitoring Suppl (ONETOUCH VERIO) w/Device KIT 1 each by Does not apply route as needed.  . canagliflozin (INVOKANA) 100 MG TABS tablet Take 1 tablet (100 mg total) by mouth daily before breakfast.  . Cholecalciferol (VITAMIN D3) 5000 units CAPS Take 1 capsule (5,000 Units total) by mouth daily.  . [DISCONTINUED] glucose blood (ONE TOUCH ULTRA TEST) test strip Use as instructed  . [DISCONTINUED] insulin NPH-regular Human (NOVOLIN 70/30) (70-30) 100 UNIT/ML injection Inject 20 Units into the skin 2 (two) times daily with a meal.   No facility-administered encounter medications on file as of 04/04/2017.    ALLERGIES: Allergies  Allergen Reactions  . Biaxin [Clarithromycin] Swelling and Other (See Comments)    Reaction:  Facial swelling    VACCINATION STATUS:  There is no immunization history on file for this patient.  Diabetes  She presents for her initial diabetic visit. She has type 2 diabetes mellitus. Onset time: She was diagnosed at approximate age of 44 years. Her disease course has been worsening (Her most recent A1c was greater than 15.5% on 02/10/2017.).  There are no hypoglycemic associated symptoms. Pertinent negatives for hypoglycemia include no confusion, headaches, pallor or seizures. Associated symptoms include blurred vision, fatigue, foot paresthesias, polydipsia, polyphagia and polyuria. Pertinent negatives for diabetes include no chest pain. There are no hypoglycemic complications. Symptoms are worsening. Diabetic complications include peripheral neuropathy. Risk factors for coronary artery disease include family history, dyslipidemia, diabetes mellitus, hypertension and sedentary lifestyle. Current diabetic treatment includes insulin injections (She says she was put on Humulin 70/30-55 units twice a day, however  she admits she has not been consistent taking this much insulin.). Her weight is increasing steadily. She is following a generally unhealthy diet. When asked about meal planning, she reported none. She has not had a previous visit with a dietitian. She participates in exercise intermittently. Her home blood glucose trend is increasing steadily. (She did not bring any meter nor logs to review today. She says she does not have a functioning meter to check blood glucose with.) An ACE inhibitor/angiotensin II receptor blocker is not being taken. Eye exam is current (She denies any retinopathy.).  Hyperlipidemia  This is a chronic problem. The current episode started more than 1 year ago. The problem is uncontrolled. Recent lipid tests were reviewed and are high. Pertinent negatives include no chest pain, myalgias or shortness of breath. She is currently on no antihyperlipidemic treatment. Risk factors for coronary artery disease include dyslipidemia, diabetes mellitus, family history, hypertension and a sedentary lifestyle.  Hypertension  This is a chronic problem. The current episode started more than 1 year ago. Associated symptoms include blurred vision. Pertinent negatives include no chest pain, headaches, palpitations or shortness of breath. Past treatments include nothing.       Review of Systems  Constitutional: Positive for fatigue. Negative for chills, fever and unexpected weight change.  HENT: Negative for trouble swallowing and voice change.   Eyes: Positive for blurred vision. Negative for visual disturbance.  Respiratory: Negative for cough, shortness of breath and wheezing.   Cardiovascular: Negative for chest pain, palpitations and leg swelling.  Gastrointestinal: Negative for diarrhea, nausea and vomiting.  Endocrine: Positive for polydipsia, polyphagia and polyuria. Negative for cold intolerance and heat intolerance.  Musculoskeletal: Negative for arthralgias and myalgias.  Skin:  Negative for color change, pallor, rash and wound.  Neurological: Negative for seizures and headaches.  Psychiatric/Behavioral: Negative for confusion and suicidal ideas.    Objective:    BP 124/82   Pulse (!) 107   Wt 189 lb (85.7 kg)   LMP 12/25/2014   SpO2 95%   BMI 29.60 kg/m   Wt Readings from Last 3 Encounters:  04/04/17 189 lb (85.7 kg)  02/12/17 177 lb (80.3 kg)    Physical Exam  Constitutional: She is oriented to person, place, and time. She appears well-developed.  HENT:  Head: Normocephalic and atraumatic.  Dry mucous membranes.  Eyes: EOM are normal.  Neck: Normal range of motion. Neck supple. No tracheal deviation present. No thyromegaly present.  Cardiovascular: Normal rate and regular rhythm.   Pulmonary/Chest: Effort normal and breath sounds normal.  Abdominal: Soft. Bowel sounds are normal. There is no tenderness. There is no guarding.  Musculoskeletal: Normal range of motion. She exhibits no edema.  Neurological: She is alert and oriented to person, place, and time. She has normal reflexes. No cranial nerve deficit. Coordination normal.  Skin: Skin is warm and dry. No rash noted. No erythema. No pallor.  Psychiatric: She has a normal mood and affect.  Reluctant and unconcerned affect. Her most  recent A1c of greater than 15.5%.     CMP ( most recent) CMP     Component Value Date/Time   NA 138 02/13/2017 0010   K 3.4 (L) 02/13/2017 0010   CL 101 02/13/2017 0010   CO2 24 02/13/2017 0010   GLUCOSE 519 (HH) 02/13/2017 0010   BUN 17 02/13/2017 0010   BUN 15 02/10/2017   CREATININE 0.71 02/13/2017 0010   CALCIUM 9.2 02/13/2017 0010   GFRNONAA >60 02/13/2017 0010   GFRAA >60 02/13/2017 0010     Diabetic Labs (most recent): Lab Results  Component Value Date   HGBA1C 15.5 02/10/2017     Lipid Panel ( most recent) Lipid Panel     Component Value Date/Time   CHOL 293 (A) 02/10/2017   TRIG 404 (A) 02/10/2017   HDL 50 02/10/2017     Assessment  & Plan:   1. Uncontrolled type 2 diabetes mellitus with complication, with long-term current use of insulin ([place])  - Patient has currently uncontrolled symptomatic type 2 DM since  51 years of age,  with most recent A1c of >15.5% %. Recent labs reviewed.   Her diabetes is complicated by complicated by peripheral neuropathy, depression and patient remains at a high risk for more acute and chronic complications which include CAD, CVA, CKD, retinopathy, and neuropathy. These are all discussed in detail with the patient.  - I have counseled the patient on diet management and weight loss, by adopting a carbohydrate restricted/protein rich diet.  - Suggestion is made for patient to avoid simple carbohydrates   from her diet including Cakes , Desserts, Ice Cream,  Soda (  diet and regular) , Sweet Tea , Candies,  Chips, Cookies, Artificial Sweeteners,   and "Sugar-free" Products . This will help patient to have stable blood glucose profile and potentially avoid unintended weight gain.  - I encouraged the patient to switch to  unprocessed or minimally processed complex starch and increased protein intake (animal or plant source), fruits, and vegetables.  - Patient is advised to stick to a routine mealtimes to eat 3 meals  a day and avoid unnecessary snacks ( to snack only to correct hypoglycemia).  - The patient will be scheduled with Jearld Fenton, RDN, CDE for individualized DM education.  - I have approached patient with the following individualized plan to manage diabetes and patient agrees:   -  Based on her current glycemic burden with A1c above 15%, she will need intensive treatment with basal/bolus insulin.  - However, it is essential to assure her commitment for proper monitoring of blood glucose for safe use of insulin.  - I approached her to initiate strict monitoring of glucose 4 times a day-before meals and at bedtime, and return in one week with her meter and logs. I prescribed a new  meter and strips for her. - In the meantime I have advised her to resume Humulin 70/30  20 units with breakfast and 20 units with supper when pre-meal readings are above 90 mg/dL. - Patient is warned not to take insulin without proper monitoring per orders. -Adjustment parameters are given for hypo and hyperglycemia in writing. -Patient is encouraged to call clinic for blood glucose levels less than 70 or above 300 mg /dl. - I will continue metformin 500 mg by mouth twice a day, therapeutically suitable for patient . - Given her significant clinical dehydration, I advised her to lower the dose of Invokana to 100 mg by mouth every morning with adequate  water.  - Patient will be considered for incretin therapy as appropriate on subsequent visits - Patient specific target  A1c;  LDL, HDL, Triglycerides, and  Waist Circumference were discussed in detail.  2) BP/HTN: Controlled. Continue current medications including ACEI/ARB. 3) Lipids/HPL:   Uncontrolled with triglycerides 404, total cholesterol 293, HDL 50.    I discussed and initiated atorvastatin 20 mg by mouth daily at bedtime side effects and precautions discussed with her.   4)  Weight/Diet: CDE Consult will be initiated , exercise, and detailed carbohydrates information provided.  5) vitamin D deficiency: New diagnosis for her. - I discussed and initiated vitamin D 3 5000 units daily for the next 90 days.  6) Chronic Care/Health Maintenance:  -Patient is not on  ACEI/ARB and  started Statin medications and encouraged to continue to follow up with Ophthalmology, Podiatrist at least yearly or according to recommendations, and advised to   stay away from smoking. I have recommended yearly flu vaccine and pneumonia vaccination at least every 5 years; moderate intensity exercise for up to 150 minutes weekly; and  sleep for at least 7 hours a day.  - 60 minutes of time was spent on the care of this patient , 50% of which was applied for  counseling on diabetes complications and their preventions.  - Patient to bring meter and  blood glucose logs during her next visit.   - I advised patient to maintain close follow up with Paula Compton, MD for primary care needs.  Follow up plan: - Return in about 1 week (around 04/11/2017) for follow up with meter and logs- no labs.  Glade Lloyd, MD Phone: [phone]  Fax: [phone]   04/04/2017, 11:09 AM

## 2017-04-07 ENCOUNTER — Ambulatory Visit (HOSPITAL_COMMUNITY): Payer: 59 | Admitting: Psychiatry

## 2017-04-12 ENCOUNTER — Encounter: Payer: Self-pay | Admitting: "Endocrinology

## 2017-04-12 ENCOUNTER — Ambulatory Visit (INDEPENDENT_AMBULATORY_CARE_PROVIDER_SITE_OTHER): Payer: 59 | Admitting: "Endocrinology

## 2017-04-12 VITALS — BP 125/74 | HR 95 | Ht 66.0 in | Wt 187.0 lb

## 2017-04-12 DIAGNOSIS — Z794 Long term (current) use of insulin: Secondary | ICD-10-CM | POA: Diagnosis not present

## 2017-04-12 DIAGNOSIS — E1165 Type 2 diabetes mellitus with hyperglycemia: Secondary | ICD-10-CM

## 2017-04-12 DIAGNOSIS — E559 Vitamin D deficiency, unspecified: Secondary | ICD-10-CM | POA: Diagnosis not present

## 2017-04-12 DIAGNOSIS — IMO0002 Reserved for concepts with insufficient information to code with codable children: Secondary | ICD-10-CM

## 2017-04-12 DIAGNOSIS — E118 Type 2 diabetes mellitus with unspecified complications: Secondary | ICD-10-CM | POA: Diagnosis not present

## 2017-04-12 DIAGNOSIS — E782 Mixed hyperlipidemia: Secondary | ICD-10-CM | POA: Diagnosis not present

## 2017-04-12 MED ORDER — INSULIN NPH ISOPHANE & REGULAR (70-30) 100 UNIT/ML ~~LOC~~ SUSP: SUBCUTANEOUS | 2 refills | Status: DC

## 2017-04-12 MED ORDER — "INSULIN SYRINGE 28G X 1/2"" 0.5 ML MISC": 1.0000 [IU] | Freq: Two times a day (BID) | 3 refills | Status: DC

## 2017-04-12 MED ORDER — LANCETS MISC: 1.0000 | 3 refills | Status: DC

## 2017-04-12 NOTE — Patient Instructions (Signed)

## 2017-04-12 NOTE — Progress Notes (Signed)
Subjective:    Patient ID: Victoria Mcdowell, female    DOB: 1965/10/19. Patient is being seen in consultation for management of diabetes requested by  Paula Compton, MD  Past Medical History:  Diagnosis Date  . Depression   . Diabetes mellitus, type II ([place])   . Elevated cholesterol    Past Surgical History:  Procedure Laterality Date  . BREAST BIOPSY Left 2016  . CARPAL TUNNEL RELEASE    . KNEE SURGERY    . SPINE SURGERY    . ulner nerve release     Social History   Social History  . Marital status: Married    Spouse name: N/A  . Number of children: N/A  . Years of education: N/A   Social History Main Topics  . Smoking status: Never Smoker  . Smokeless tobacco: Never Used  . Alcohol use No  . Drug use: No  . Sexual activity: Not Currently   Other Topics Concern  . None   Social History Narrative  . None   Outpatient Encounter Prescriptions as of 04/12/2017  Medication Sig  . atorvastatin (LIPITOR) 20 MG tablet Take 1 tablet (20 mg total) by mouth daily.  . baclofen (LIORESAL) 10 MG tablet Take 10 mg by mouth 3 (three) times daily as needed for muscle spasms.   . Blood Glucose Monitoring Suppl (ONETOUCH VERIO) w/Device KIT 1 each by Does not apply route as needed.  . canagliflozin (INVOKANA) 100 MG TABS tablet Take 1 tablet (100 mg total) by mouth daily before breakfast.  . Cholecalciferol (VITAMIN D3) 5000 units CAPS Take 1 capsule (5,000 Units total) by mouth daily.  . DULoxetine (CYMBALTA) 60 MG capsule Take 1 capsule (60 mg total) by mouth 2 (two) times daily.  Marland Kitchen gabapentin (NEURONTIN) 600 MG tablet Take 600 mg by mouth 3 (three) times daily.  . insulin NPH-regular Human (HUMULIN 70/30) (70-30) 100 UNIT/ML injection Inject 30 units with breakfast and 30 units with supper when pre-meal blood glucose is above 90 mg/dL.  . INSULIN SYRINGE .5CC/28G 28G X 1/2" 0.5 ML MISC 1 Units by Does not apply route 2 (two) times daily.  . Lancets MISC 1 each by Does not apply  route as directed.  . metFORMIN (GLUCOPHAGE) 500 MG tablet Take 1 tablet (500 mg total) by mouth 2 (two) times daily with a meal.  . oxyCODONE-acetaminophen (PERCOCET) 10-325 MG per tablet Take 1 tablet by mouth every 6 (six) hours as needed for pain.  . traZODone (DESYREL) 150 MG tablet Take 1 tablet (150 mg total) by mouth at bedtime.  . [DISCONTINUED] Insulin NPH Isophane & Regular (HUMULIN 70/30 [place]) Inject 30 Units into the skin 2 (two) times daily before a meal.  . [DISCONTINUED] INSULIN SYRINGE .5CC/28G 28G X 1/2" 0.5 ML MISC 1 Units by Does not apply route 2 (two) times daily.   No facility-administered encounter medications on file as of 04/12/2017.    ALLERGIES: Allergies  Allergen Reactions  . Biaxin [Clarithromycin] Swelling and Other (See Comments)    Reaction:  Facial swelling    VACCINATION STATUS:  There is no immunization history on file for this patient.  Diabetes  She presents for her follow-up diabetic visit. She has type 2 diabetes mellitus. Onset time: She was diagnosed at approximate age of 37 years. Her disease course has been improving (Her most recent A1c was greater than 15.5% on 02/10/2017.). There are no hypoglycemic associated symptoms. Pertinent negatives for hypoglycemia include no confusion, headaches, pallor or  seizures. Associated symptoms include blurred vision, fatigue, foot paresthesias, polydipsia and polyuria. Pertinent negatives for diabetes include no chest pain and no polyphagia. There are no hypoglycemic complications. Symptoms are improving. Diabetic complications include peripheral neuropathy. Risk factors for coronary artery disease include family history, dyslipidemia, diabetes mellitus, hypertension and sedentary lifestyle. Current diabetic treatment includes insulin injections (She says she was put on Humulin 70/30-55 units twice a day, however she admits she has not been consistent taking this much insulin.). Her weight is stable. She is following  a generally unhealthy diet. When asked about meal planning, she reported none. She has not had a previous visit with a dietitian. She participates in exercise intermittently. Her home blood glucose trend is decreasing steadily. Her breakfast blood glucose range is generally >200 mg/dl. Her lunch blood glucose range is generally >200 mg/dl. Her dinner blood glucose range is generally >200 mg/dl. Her bedtime blood glucose range is generally >200 mg/dl. Her overall blood glucose range is >200 mg/dl. An ACE inhibitor/angiotensin II receptor blocker is not being taken. Eye exam is current (She denies any retinopathy.).  Hyperlipidemia  This is a chronic problem. The current episode started more than 1 year ago. The problem is uncontrolled. Recent lipid tests were reviewed and are high. Pertinent negatives include no chest pain, myalgias or shortness of breath. She is currently on no antihyperlipidemic treatment. Risk factors for coronary artery disease include dyslipidemia, diabetes mellitus, family history, hypertension and a sedentary lifestyle.  Hypertension  This is a chronic problem. The current episode started more than 1 year ago. Associated symptoms include blurred vision. Pertinent negatives include no chest pain, headaches, palpitations or shortness of breath. Past treatments include nothing.     Review of Systems  Constitutional: Positive for fatigue. Negative for chills, fever and unexpected weight change.  HENT: Negative for trouble swallowing and voice change.   Eyes: Positive for blurred vision. Negative for visual disturbance.  Respiratory: Negative for cough, shortness of breath and wheezing.   Cardiovascular: Negative for chest pain, palpitations and leg swelling.  Gastrointestinal: Negative for diarrhea, nausea and vomiting.  Endocrine: Positive for polydipsia and polyuria. Negative for cold intolerance, heat intolerance and polyphagia.  Musculoskeletal: Negative for arthralgias and  myalgias.  Skin: Negative for color change, pallor, rash and wound.  Neurological: Negative for seizures and headaches.  Psychiatric/Behavioral: Negative for confusion and suicidal ideas.    Objective:    BP 125/74   Pulse 95   Ht _0  (1.676 m)   Wt 187 lb (84.8 kg)   LMP 12/25/2014   BMI 30.18 kg/m   Wt Readings from Last 3 Encounters:  04/12/17 187 lb (84.8 kg)  04/04/17 189 lb (85.7 kg)  02/12/17 177 lb (80.3 kg)    Physical Exam  Constitutional: She is oriented to person, place, and time. She appears well-developed.  HENT:  Head: Normocephalic and atraumatic.  Dry mucous membranes.  Eyes: EOM are normal.  Neck: Normal range of motion. Neck supple. No tracheal deviation present. No thyromegaly present.  Cardiovascular: Normal rate and regular rhythm.   Pulmonary/Chest: Effort normal and breath sounds normal.  Abdominal: Soft. Bowel sounds are normal. There is no tenderness. There is no guarding.  Musculoskeletal: Normal range of motion. She exhibits no edema.  Neurological: She is alert and oriented to person, place, and time. She has normal reflexes. No cranial nerve deficit. Coordination normal.  Skin: Skin is warm and dry. No rash noted. No erythema. No pallor.  Psychiatric: She has a normal  mood and affect.  Reluctant and unconcerned affect. Her most recent A1c of greater than 15.5%.     CMP ( most recent) CMP     Component Value Date/Time   NA 138 02/13/2017 0010   K 3.4 (L) 02/13/2017 0010   CL 101 02/13/2017 0010   CO2 24 02/13/2017 0010   GLUCOSE 519 (HH) 02/13/2017 0010   BUN 17 02/13/2017 0010   BUN 15 02/10/2017   CREATININE 0.71 02/13/2017 0010   CALCIUM 9.2 02/13/2017 0010   GFRNONAA >60 02/13/2017 0010   GFRAA >60 02/13/2017 0010     Diabetic Labs (most recent): Lab Results  Component Value Date   HGBA1C 15.5 02/10/2017     Lipid Panel ( most recent) Lipid Panel     Component Value Date/Time   CHOL 293 (A) 02/10/2017   TRIG 404 (A)  02/10/2017   HDL 50 02/10/2017     Assessment & Plan:   1. Uncontrolled type 2 diabetes mellitus with complication, with long-term current use of insulin ([place])  - Patient has currently uncontrolled symptomatic type 2 DM since  51 years of age,  with most recent A1c of >15.5% %. - She came with persistently above target blood glucose readings with slight overall improvement. No hypoglycemia.  Recent labs reviewed.   Her diabetes is complicated by complicated by peripheral neuropathy, depression and patient remains at a high risk for more acute and chronic complications which include CAD, CVA, CKD, retinopathy, and neuropathy. These are all discussed in detail with the patient.  - I have counseled the patient on diet management and weight loss, by adopting a carbohydrate restricted/protein rich diet.  - Suggestion is made for patient to avoid simple carbohydrates   from her diet including Cakes ,Sweet  Desserts, Ice Cream,  Soda (  diet and regular) , Sweet Tea , Candies,  Chips, Cookies, Artificial Sweeteners,   and "Sugar-free" Products . This will help patient to have stable blood glucose profile and potentially avoid unintended weight gain.  - I encouraged the patient to switch to  unprocessed or minimally processed complex starch and increased protein intake (animal or plant source), fruits, and vegetables.  - Patient is advised to stick to a routine mealtimes to eat 3 meals  a day and avoid unnecessary snacks ( to snack only to correct hypoglycemia).  - I have approached patient with the following individualized plan to manage diabetes and patient agrees:   -  Based on her current glycemic burden with A1c above 15%, she will need intensive treatment with basal/bolus insulin.  - However, it is essential to assure her commitment for proper monitoring of blood glucose for safe use of insulin.  - Humulin  70/30 can still be used at adjusted dose. - I approached her to continue strict  monitoring of glucose 4 times a day-before meals and at bedtime. - In the meantime I have advised her to  increase Humulin 70/30  To 30 units with breakfast and 30 units with supper when pre-meal readings are above 90 mg/dL. - Patient is warned not to take insulin without proper monitoring per orders. -Adjustment parameters are given for hypo and hyperglycemia in writing. -Patient is encouraged to call clinic for blood glucose levels less than 70 or above 300 mg /dl. - I will continue metformin 500 mg by mouth twice a day, therapeutically suitable for patient . -I advised her to continue Invokana  100 mg by mouth every morning with adequate water.  -  Patient will be considered for incretin therapy as appropriate on subsequent visits - Patient specific target  A1c;  LDL, HDL, Triglycerides, and  Waist Circumference were discussed in detail.  2) BP/HTN: Controlled. Continue current medications including ACEI/ARB. 3) Lipids/HPL:   Uncontrolled with triglycerides 404, total cholesterol 293, HDL 50.   she is tolerating atorvastatin 20 mg by mouth daily at bedtime side effects and precautions discussed with her.   4)  Weight/Diet: CDE Consult has been initiated , exercise, and detailed carbohydrates information provided.  5) vitamin D deficiency: New diagnosis for her. -  she is on  vitamin D 3 5000 units daily for the next 90 days.  6) Chronic Care/Health Maintenance:  -Patient is not on  ACEI/ARB and  started Statin medications and encouraged to continue to follow up with Ophthalmology, Podiatrist at least yearly or according to recommendations, and advised to   stay away from smoking. I have recommended yearly flu vaccine and pneumonia vaccination at least every 5 years; moderate intensity exercise for up to 150 minutes weekly; and  sleep for at least 7 hours a day.  - Time spent with the patient: 25 min, of which >50% was spent in reviewing her sugar logs, discussing her hypo- and hyper-glycemic  episodes, reviewing  previous labs and insulin doses and developing a plan to avoid hypo- and hyper-glycemia.   - Patient to bring meter and  blood glucose logs during her next visit.  - I advised patient to maintain close follow up with Paula Compton, MD for primary care needs.  Follow up plan: - Return in about 5 weeks (around 05/17/2017) for meter, and logs.  Glade Lloyd, MD Phone: [phone]  Fax: [phone]   04/12/2017, 10:51 AM

## 2017-04-13 ENCOUNTER — Other Ambulatory Visit: Payer: Self-pay

## 2017-04-13 MED ORDER — INSULIN NPH ISOPHANE & REGULAR (70-30) 100 UNIT/ML ~~LOC~~ SUSP: SUBCUTANEOUS | 2 refills | Status: DC

## 2017-04-15 ENCOUNTER — Encounter (HOSPITAL_COMMUNITY): Payer: Self-pay | Admitting: Psychiatry

## 2017-04-15 ENCOUNTER — Ambulatory Visit (INDEPENDENT_AMBULATORY_CARE_PROVIDER_SITE_OTHER): Payer: 59 | Admitting: Psychiatry

## 2017-04-15 VITALS — BP 114/64 | HR 95 | Ht 66.0 in | Wt 187.0 lb

## 2017-04-15 DIAGNOSIS — M4800 Spinal stenosis, site unspecified: Secondary | ICD-10-CM | POA: Diagnosis not present

## 2017-04-15 DIAGNOSIS — M549 Dorsalgia, unspecified: Secondary | ICD-10-CM

## 2017-04-15 DIAGNOSIS — Z560 Unemployment, unspecified: Secondary | ICD-10-CM | POA: Diagnosis not present

## 2017-04-15 DIAGNOSIS — Z818 Family history of other mental and behavioral disorders: Secondary | ICD-10-CM

## 2017-04-15 DIAGNOSIS — F322 Major depressive disorder, single episode, severe without psychotic features: Secondary | ICD-10-CM

## 2017-04-15 DIAGNOSIS — R454 Irritability and anger: Secondary | ICD-10-CM | POA: Diagnosis not present

## 2017-04-15 DIAGNOSIS — M2550 Pain in unspecified joint: Secondary | ICD-10-CM

## 2017-04-15 MED ORDER — DULOXETINE HCL 60 MG PO CPEP: 60.0000 mg | ORAL_CAPSULE | Freq: Two times a day (BID) | ORAL | 2 refills | Status: DC

## 2017-04-15 MED ORDER — TRAZODONE HCL 150 MG PO TABS: 150.0000 mg | ORAL_TABLET | Freq: Every day | ORAL | 2 refills | Status: DC
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
## 2017-05-17 ENCOUNTER — Ambulatory Visit: Payer: 59 | Admitting: "Endocrinology

## 2017-05-19 ENCOUNTER — Ambulatory Visit: Payer: Self-pay | Admitting: Nutrition

## 2017-05-21 LAB — RENAL FUNCTION PANEL
ALBUMIN MSPROF: 4.3 g/dL (ref 3.6–5.1)
BUN: 21 mg/dL (ref 7–25)
CALCIUM: 9.2 mg/dL (ref 8.6–10.4)
CO2: 30 mmol/L (ref 20–32)
Chloride: 103 mmol/L (ref 98–110)
Creat: 0.8 mg/dL (ref 0.50–1.05)
GLUCOSE: 231 mg/dL — AB (ref 65–99)
POTASSIUM: 4.5 mmol/L (ref 3.5–5.3)
Phosphorus: 3.8 mg/dL (ref 2.5–4.5)
SODIUM: 140 mmol/L (ref 135–146)

## 2017-05-21 LAB — LIPID PANEL
CHOL/HDL RATIO: 3.2 (calc) (ref <5.0)
CHOLESTEROL: 166 mg/dL (ref <200)
HDL: 52 mg/dL (ref >50)
LDL Cholesterol (Calc): 91 mg/dL (calc)
NON-HDL CHOLESTEROL (CALC): 114 mg/dL (ref <130)
Triglycerides: 132 mg/dL (ref <150)

## 2017-05-21 LAB — HEMOGLOBIN A1C
EAG (MMOL/L): 14.3 (calc)
Hgb A1c MFr Bld: 10.6 % of total Hgb — ABNORMAL HIGH (ref <5.7)
MEAN PLASMA GLUCOSE: 258 (calc)

## 2017-05-21 LAB — VITAMIN D 25 HYDROXY (VIT D DEFICIENCY, FRACTURES): VIT D 25 HYDROXY: 54 ng/mL (ref 30–100)

## 2017-05-21 LAB — T4, FREE: FREE T4: 1.1 ng/dL (ref 0.8–1.8)

## 2017-05-21 LAB — MICROALBUMIN / CREATININE URINE RATIO
Creatinine, Urine: 79 mg/dL (ref 20–275)
Microalb, Ur: <0.2 mg/dL

## 2017-05-21 LAB — TSH: TSH: 1.22 mIU/L

## 2017-06-02 ENCOUNTER — Encounter: Payer: Self-pay | Admitting: "Endocrinology

## 2017-06-02 ENCOUNTER — Ambulatory Visit (INDEPENDENT_AMBULATORY_CARE_PROVIDER_SITE_OTHER): Payer: 59 | Admitting: "Endocrinology

## 2017-06-02 ENCOUNTER — Other Ambulatory Visit: Payer: Self-pay

## 2017-06-02 VITALS — BP 112/75 | HR 102 | Ht 66.0 in | Wt 188.0 lb

## 2017-06-02 DIAGNOSIS — E782 Mixed hyperlipidemia: Secondary | ICD-10-CM | POA: Diagnosis not present

## 2017-06-02 DIAGNOSIS — Z794 Long term (current) use of insulin: Secondary | ICD-10-CM | POA: Diagnosis not present

## 2017-06-02 DIAGNOSIS — E118 Type 2 diabetes mellitus with unspecified complications: Secondary | ICD-10-CM | POA: Diagnosis not present

## 2017-06-02 DIAGNOSIS — IMO0002 Reserved for concepts with insufficient information to code with codable children: Secondary | ICD-10-CM

## 2017-06-02 DIAGNOSIS — E559 Vitamin D deficiency, unspecified: Secondary | ICD-10-CM | POA: Diagnosis not present

## 2017-06-02 DIAGNOSIS — E1165 Type 2 diabetes mellitus with hyperglycemia: Secondary | ICD-10-CM | POA: Diagnosis not present

## 2017-06-02 MED ORDER — METFORMIN HCL 1000 MG PO TABS: 1000.0000 mg | ORAL_TABLET | Freq: Two times a day (BID) | ORAL | 2 refills | Status: DC

## 2017-06-02 MED ORDER — LANCETS MISC: 1.0000 | 3 refills | Status: AC

## 2017-06-02 MED ORDER — DULAGLUTIDE 0.75 MG/0.5ML ~~LOC~~ SOAJ: 0.7500 mg | SUBCUTANEOUS | 2 refills | Status: DC

## 2017-06-02 MED ORDER — "INSULIN SYRINGE 28G X 1/2"" 0.5 ML MISC": 1.0000 [IU] | Freq: Two times a day (BID) | 3 refills | Status: DC

## 2017-06-02 MED ORDER — INSULIN NPH ISOPHANE & REGULAR (70-30) 100 UNIT/ML ~~LOC~~ SUSP: SUBCUTANEOUS | 2 refills | Status: DC

## 2017-06-02 NOTE — Patient Instructions (Signed)

## 2017-06-02 NOTE — Progress Notes (Signed)
Subjective:    Patient ID: Victoria Mcdowell, female    DOB: 10/04/1965. Patient is being seen in consultation for management of diabetes requested by  Paula Compton, MD  Past Medical History:  Diagnosis Date  . Depression   . Diabetes mellitus, type II ([place])   . Elevated cholesterol    Past Surgical History:  Procedure Laterality Date  . BREAST BIOPSY Left 2016  . CARPAL TUNNEL RELEASE    . KNEE SURGERY    . SPINE SURGERY    . ulner nerve release     Social History   Social History  . Marital status: Married    Spouse name: N/A  . Number of children: N/A  . Years of education: N/A   Social History Main Topics  . Smoking status: Never Smoker  . Smokeless tobacco: Never Used  . Alcohol use No  . Drug use: No  . Sexual activity: Not Currently   Other Topics Concern  . None   Social History Narrative  . None   Outpatient Encounter Prescriptions as of 06/02/2017  Medication Sig  . atorvastatin (LIPITOR) 20 MG tablet Take 1 tablet (20 mg total) by mouth daily.  . baclofen (LIORESAL) 10 MG tablet Take 10 mg by mouth 3 (three) times daily as needed for muscle spasms.   . Blood Glucose Monitoring Suppl (ONETOUCH VERIO) w/Device KIT 1 each by Does not apply route as needed.  . Cholecalciferol (VITAMIN D3) 5000 units CAPS Take 1 capsule (5,000 Units total) by mouth daily.  . Dulaglutide (TRULICITY) 4.49 EE/1.0OF SOPN Inject 0.75 mg into the skin once a week.  . DULoxetine (CYMBALTA) 60 MG capsule Take 1 capsule (60 mg total) by mouth 2 (two) times daily.  Marland Kitchen gabapentin (NEURONTIN) 600 MG tablet Take 600 mg by mouth 3 (three) times daily.  . insulin NPH-regular Human (HUMULIN 70/30) (70-30) 100 UNIT/ML injection Inject 40 units with breakfast and 40 units with supper when pre-meal blood glucose is above 90 mg/dL.  . INSULIN SYRINGE .5CC/28G 28G X 1/2" 0.5 ML MISC 1 Units by Does not apply route 2 (two) times daily.  . Lancets MISC 1 each by Does not apply route as directed.   . metFORMIN (GLUCOPHAGE) 1000 MG tablet Take 1 tablet (1,000 mg total) by mouth 2 (two) times daily with a meal.  . oxyCODONE-acetaminophen (PERCOCET) 10-325 MG per tablet Take 1 tablet by mouth every 6 (six) hours as needed for pain.  . traZODone (DESYREL) 150 MG tablet Take 1 tablet (150 mg total) by mouth at bedtime.  . [DISCONTINUED] canagliflozin (INVOKANA) 100 MG TABS tablet Take 1 tablet (100 mg total) by mouth daily before breakfast.  . [DISCONTINUED] insulin NPH-regular Human (HUMULIN 70/30) (70-30) 100 UNIT/ML injection Inject 30 units with breakfast and 30 units with supper when pre-meal blood glucose is above 90 mg/dL.  . [DISCONTINUED] metFORMIN (GLUCOPHAGE) 500 MG tablet Take 1 tablet (500 mg total) by mouth 2 (two) times daily with a meal.   No facility-administered encounter medications on file as of 06/02/2017.    ALLERGIES: Allergies  Allergen Reactions  . Biaxin [Clarithromycin] Swelling and Other (See Comments)    Reaction:  Facial swelling    VACCINATION STATUS:  There is no immunization history on file for this patient.  Diabetes  She presents for her follow-up diabetic visit. She has type 2 diabetes mellitus. Her disease course has been improving. There are no hypoglycemic associated symptoms. Pertinent negatives for hypoglycemia include no confusion, headaches,  pallor or seizures. Associated symptoms include blurred vision, fatigue, foot paresthesias, polydipsia and polyuria. Pertinent negatives for diabetes include no chest pain and no polyphagia. There are no hypoglycemic complications. Symptoms are improving. Diabetic complications include peripheral neuropathy. Risk factors for coronary artery disease include family history, dyslipidemia, diabetes mellitus, hypertension and sedentary lifestyle. Current diabetic treatment includes insulin injections (She says she was put on Humulin 70/30-55 units twice a day, however she admits she has not been consistent taking this  much insulin.). Her weight is stable. She is following a generally unhealthy diet. When asked about meal planning, she reported none. She has not had a previous visit with a dietitian. She participates in exercise intermittently. Her home blood glucose trend is decreasing steadily. Her breakfast blood glucose range is generally >200 mg/dl. Her lunch blood glucose range is generally >200 mg/dl. Her dinner blood glucose range is generally >200 mg/dl. Her bedtime blood glucose range is generally >200 mg/dl. Her overall blood glucose range is >200 mg/dl. An ACE inhibitor/angiotensin II receptor blocker is not being taken. Eye exam is current (She denies any retinopathy.).  Hyperlipidemia  This is a chronic problem. The current episode started more than 1 year ago. The problem is uncontrolled. Recent lipid tests were reviewed and are high. Pertinent negatives include no chest pain, myalgias or shortness of breath. She is currently on no antihyperlipidemic treatment. Risk factors for coronary artery disease include dyslipidemia, diabetes mellitus, family history, hypertension and a sedentary lifestyle.  Hypertension  This is a chronic problem. The current episode started more than 1 year ago. Associated symptoms include blurred vision. Pertinent negatives include no chest pain, headaches, palpitations or shortness of breath. Past treatments include nothing.     Review of Systems  Constitutional: Positive for fatigue. Negative for chills, fever and unexpected weight change.  HENT: Negative for trouble swallowing and voice change.   Eyes: Positive for blurred vision. Negative for visual disturbance.  Respiratory: Negative for cough, shortness of breath and wheezing.   Cardiovascular: Negative for chest pain, palpitations and leg swelling.  Gastrointestinal: Negative for diarrhea, nausea and vomiting.  Endocrine: Positive for polydipsia and polyuria. Negative for cold intolerance, heat intolerance and  polyphagia.  Musculoskeletal: Negative for arthralgias and myalgias.  Skin: Negative for color change, pallor, rash and wound.  Neurological: Negative for seizures and headaches.  Psychiatric/Behavioral: Negative for confusion and suicidal ideas.    Objective:    BP 112/75   Pulse (!) 102   Ht 5' 6" (1.676 m)   Wt 188 lb (85.3 kg)   LMP 12/25/2014   BMI 30.34 kg/m   Wt Readings from Last 3 Encounters:  06/02/17 188 lb (85.3 kg)  04/12/17 187 lb (84.8 kg)  04/04/17 189 lb (85.7 kg)    Physical Exam  Constitutional: She is oriented to person, place, and time. She appears well-developed.  HENT:  Head: Normocephalic and atraumatic.  Dry mucous membranes.  Eyes: EOM are normal.  Neck: Normal range of motion. Neck supple. No tracheal deviation present. No thyromegaly present.  Cardiovascular: Normal rate and regular rhythm.   Pulmonary/Chest: Effort normal and breath sounds normal.  Abdominal: Soft. Bowel sounds are normal. There is no tenderness. There is no guarding.  Musculoskeletal: Normal range of motion. She exhibits no edema.  Neurological: She is alert and oriented to person, place, and time. She has normal reflexes. No cranial nerve deficit. Coordination normal.  Skin: Skin is warm and dry. No rash noted. No erythema. No pallor.  Psychiatric: She  has a normal mood and affect.  Reluctant and unconcerned affect.    CMP     Component Value Date/Time   NA 140 05/20/2017 1112   K 4.5 05/20/2017 1112   CL 103 05/20/2017 1112   CO2 30 05/20/2017 1112   GLUCOSE 231 (H) 05/20/2017 1112   BUN 21 05/20/2017 1112   BUN 15 02/10/2017   CREATININE 0.80 05/20/2017 1112   CALCIUM 9.2 05/20/2017 1112   GFRNONAA >60 02/13/2017 0010   GFRAA >60 02/13/2017 0010   Diabetic Labs (most recent): Lab Results  Component Value Date   HGBA1C 10.6 (H) 05/20/2017   HGBA1C 15.5 02/10/2017     Lipid Panel ( most recent) Lipid Panel     Component Value Date/Time   CHOL 166  05/20/2017 1112   TRIG 132 05/20/2017 1112   HDL 52 05/20/2017 1112   CHOLHDL 3.2 05/20/2017 1112     Assessment & Plan:   1. Uncontrolled type 2 diabetes mellitus with complication, with long-term current use of insulin ([place])  - Patient has currently uncontrolled symptomatic type 2 DM since  51 years of age,  with most recent A1c of 10.6% improving from >15.5% %. - She came with improving but still significantly  above target blood glucose readings . No hypoglycemia.  Recent labs reviewed.   Her diabetes is complicated by  peripheral neuropathy, depression and patient remains at a high risk for more acute and chronic complications which include CAD, CVA, CKD, retinopathy, and neuropathy. These are all discussed in detail with the patient.  - I have counseled the patient on diet management and weight loss, by adopting a carbohydrate restricted/protein rich diet.  Suggestion is made for her to avoid simple carbohydrates  from her diet including Cakes, Sweet Desserts, Ice Cream, Soda (diet and regular), Sweet Tea, Candies, Chips, Cookies, Store Bought Juices, Alcohol in Excess of  1-2 drinks a day, Artificial Sweeteners, and "Sugar-free" Products. This will help patient to have stable blood glucose profile and potentially avoid unintended weight gain.   - I encouraged the patient to switch to  unprocessed or minimally processed complex starch and increased protein intake (animal or plant source), fruits, and vegetables.  - Patient is advised to stick to a routine mealtimes to eat 3 meals  a day and avoid unnecessary snacks ( to snack only to correct hypoglycemia).  - I have approached patient with the following individualized plan to manage diabetes and patient agrees:   -  Based on her current glycemic burden she will need intensive treatment with basal/bolus insulin.  - However, it is essential to assure her commitment for proper monitoring of blood glucose for safe use of insulin.  -  Humulin  70/30 can still be used at adjusted dose. - I approached her to continue strict monitoring of glucose 4 times a day-before meals and at bedtime. - In the meantime I have advised her to  increase Humulin 70/30  to 40 units with breakfast and 40 units with supper when pre-meal readings are above 90 mg/dL. - Patient is warned not to take insulin without proper monitoring per orders.  -Patient is encouraged to call clinic for blood glucose levels less than 70 or above 300 mg /dl. - I will increase metformin to 1000 mg by mouth twice a day , therapeutically suitable for patient . -I advised her to discontinue Invokana. - She is interested to get weekly shots of Trulicity, no contraindications if her insurance approves it. I will  prescribed 1.96 mg of trulicity subcutaneously weekly to advance if tolerated.  - Patient specific target  A1c;  LDL, HDL, Triglycerides, and  Waist Circumference were discussed in detail.  2) BP/HTN: Controlled. Continue current medications including ACEI/ARB. 3) Lipids/HPL:   controlled with triglycerides improving to 132 from  404, total cholesterol   improving to 166 from 293, HDL 52, and LDL 91 .   she is tolerating atorvastatin 20 mg by mouth daily at bedtime side effects and precautions discussed with her.   4)  Weight/Diet: CDE Consult has been initiated , exercise, and detailed carbohydrates information provided.  5) vitamin D deficiency:  -   She was recently initiated on  vitamin D 3 5000 units daily for the next 90 days.  6) Chronic Care/Health Maintenance:  -Patient is not on  ACEI/ARB and  started Statin medications and encouraged to continue to follow up with Ophthalmology, Podiatrist at least yearly or according to recommendations, and advised to   stay away from smoking. I have recommended yearly flu vaccine and pneumonia vaccination at least every 5 years; moderate intensity exercise for up to 150 minutes weekly; and  sleep for at least 7 hours a  day.  - Time spent with the patient: 25 min, of which >50% was spent in reviewing her sugar logs , discussing her hypo- and hyper-glycemic episodes, reviewing her current and  previous labs and insulin doses and developing a plan to avoid hypo- and hyper-glycemia.   - I advised patient to maintain close follow up with Paula Compton, MD for primary care needs.  Follow up plan: - Return in about 3 months (around 09/02/2017) for meter, and logs.  Glade Lloyd, MD Phone: [phone]  Fax: [phone]   This note was partially dictated with voice recognition software. Similar sounding words can be transcribed inadequately or may not  be corrected upon review.  06/02/2017, 12:42 PM

## 2017-06-20 ENCOUNTER — Ambulatory Visit: Payer: Self-pay | Admitting: Nutrition

## 2017-06-21 ENCOUNTER — Telehealth: Payer: Self-pay | Admitting: "Endocrinology

## 2017-06-21 MED ORDER — INSULIN NPH ISOPHANE & REGULAR (70-30) 100 UNIT/ML ~~LOC~~ SUSP: SUBCUTANEOUS | 2 refills | Status: DC

## 2017-06-21 NOTE — Telephone Encounter (Signed)
Courtney from [facility] is asking for a new Rx for American ExpressHumalin for Victoria DawleyLisa Martin stating that she is doing 40 u in the am and 40 at dinner now instead of 30, please advise

## 2017-06-23 ENCOUNTER — Other Ambulatory Visit: Payer: Self-pay | Admitting: "Endocrinology

## 2017-07-05 ENCOUNTER — Telehealth: Payer: Self-pay

## 2017-07-05 NOTE — Telephone Encounter (Signed)
Pt states that she has stopped taking the trulicity due to severe vaginal & rectal itching. She states since she stopped the medication, the itching has stopped. Please advise

## 2017-07-05 NOTE — Telephone Encounter (Signed)
Pt.notified

## 2017-07-05 NOTE — Telephone Encounter (Signed)
She can stay off of Trulicity, stay on insulin and metformin until next visit when we'll discuss her other options.

## 2017-07-15 ENCOUNTER — Ambulatory Visit (HOSPITAL_COMMUNITY): Payer: Self-pay | Admitting: Psychiatry

## 2017-08-04 ENCOUNTER — Encounter: Payer: 59 | Attending: Obstetrics and Gynecology | Admitting: Nutrition

## 2017-08-04 ENCOUNTER — Encounter: Payer: Self-pay | Admitting: Nutrition

## 2017-08-04 VITALS — Ht 67.0 in | Wt 196.0 lb

## 2017-08-04 DIAGNOSIS — IMO0002 Reserved for concepts with insufficient information to code with codable children: Secondary | ICD-10-CM

## 2017-08-04 DIAGNOSIS — E118 Type 2 diabetes mellitus with unspecified complications: Secondary | ICD-10-CM | POA: Insufficient documentation

## 2017-08-04 DIAGNOSIS — Z794 Long term (current) use of insulin: Secondary | ICD-10-CM | POA: Diagnosis not present

## 2017-08-04 DIAGNOSIS — E1165 Type 2 diabetes mellitus with hyperglycemia: Secondary | ICD-10-CM | POA: Diagnosis not present

## 2017-08-04 DIAGNOSIS — Z713 Dietary counseling and surveillance: Secondary | ICD-10-CM | POA: Diagnosis not present

## 2017-08-04 DIAGNOSIS — E669 Obesity, unspecified: Secondary | ICD-10-CM

## 2017-08-04 NOTE — Progress Notes (Signed)
  Medical Nutrition Therapy:  Appt start time: [id number] end time:  1200.   Assessment:  Primary concerns today: Diabetes Type. Lives with her wife.  She is here with her wife. She does the cooking and they both shop. Most foods are baked and broiled.  Eats three meals per day. BS's in the 200's. Testing blood usgars 4 times per day. 70/30 40 units BID. No physical actviity due to back and knee issues. Willing to do water aerobics at [employer]. Current diet is excessive in calories causing weight gain and higher blood sugars. Diet is low in fresh fruits and vegetables. Picky eater. Snacks often and eating high fat high sugar foods.   Lab Results  Component Value Date   HGBA1C 10.6 (H) 05/20/2017     Preferred Learning Style:   No preference indicated   Learning Readiness:    Ready  Change in progress   MEDICATIONS: See list   DIETARY INTAKE:  24-hr recall:  B ( AM): PB crunch 1 1/2 cup, 2% milk,   Snk ( AM): sometimes fruit , Milk or water L ( PM): [place] croissants, 2, Diet soda Snk ( PM): cheese D ( PM): Roast beef, gravy, mashed potatoes and broccoli, water Snk ( PM): 4 oz  Beverages: water, milk, diet sodas  Usual physical activity: Walks some.  Estimated energy needs: 1500  calories 170  g carbohydrates 112 g protein 42 g fat  Progress Towards Goal(s):  In progress.   Nutritional Diagnosis:  NB-1.1 Food and nutrition-related knowledge deficit As related to Diabetes.  As evidenced by A1C 10.6%.    Intervention:  Nutrition and Diabetes education provided on My Plate, CHO counting, meal planning, portion sizes, timing of meals, avoiding snacks between meals unless having a low blood sugar, target ranges for A1C and blood sugars, signs/symptoms and treatment of hyper/hypoglycemia, monitoring blood sugars, taking medications as prescribed, benefits of exercising 30 minutes per day and prevention of complications of DM. Marland Kitchen. Goals 1. Follow My Plate 2. Get some  sectional plates Cut out snacks between meals. 3. Cut out diet soda 4. Start exercising 30 minutes 3-4 times per week. 5. Get A1C down to 7% Increase fresh fruits and vegetables.  Teaching Method Utilized:  Visual Auditory Hands on  Handouts given during visit include:  The Plate Method   Meal Plan Card  Barriers to learning/adherence to lifestyle change:  none  Demonstrated degree of understanding via:  Teach Back   Monitoring/Evaluation:  Dietary intake, exercise,  Meal planing, , and body weight in 1 month(s). May consider MDI"s of basal/bolus insulin regimen for better blood sugar control. May also benefit from a DPP4 of Januvia or Tradjenta.

## 2017-08-04 NOTE — Patient Instructions (Addendum)
Goals 1. Follow My Plate 2. Get some sectional plates 3. Cut out diet soda 4. Start exercising 30 minutes 3-4 times per week. 5. Get A1C down to 7% Increase fresh fruits and vegetables.

## 2017-08-25 ENCOUNTER — Encounter: Payer: Self-pay | Admitting: Nutrition

## 2017-08-31 ENCOUNTER — Other Ambulatory Visit: Payer: Self-pay | Admitting: "Endocrinology

## 2017-09-01 ENCOUNTER — Ambulatory Visit (HOSPITAL_COMMUNITY): Payer: 59 | Admitting: Psychiatry

## 2017-09-02 ENCOUNTER — Ambulatory Visit: Payer: 59 | Admitting: "Endocrinology

## 2017-09-05 ENCOUNTER — Ambulatory Visit: Payer: Self-pay | Admitting: Nutrition

## 2017-09-05 ENCOUNTER — Ambulatory Visit: Payer: 59 | Admitting: "Endocrinology

## 2017-09-12 ENCOUNTER — Other Ambulatory Visit: Payer: Self-pay | Admitting: "Endocrinology

## 2017-09-14 ENCOUNTER — Ambulatory Visit (INDEPENDENT_AMBULATORY_CARE_PROVIDER_SITE_OTHER): Payer: 59 | Admitting: Psychiatry

## 2017-09-14 ENCOUNTER — Encounter (HOSPITAL_COMMUNITY): Payer: Self-pay | Admitting: Psychiatry

## 2017-09-14 ENCOUNTER — Other Ambulatory Visit: Payer: Self-pay

## 2017-09-14 VITALS — BP 120/75 | HR 116 | Ht 67.0 in | Wt 192.0 lb

## 2017-09-14 DIAGNOSIS — M549 Dorsalgia, unspecified: Secondary | ICD-10-CM | POA: Diagnosis not present

## 2017-09-14 DIAGNOSIS — Z818 Family history of other mental and behavioral disorders: Secondary | ICD-10-CM

## 2017-09-14 DIAGNOSIS — Z560 Unemployment, unspecified: Secondary | ICD-10-CM

## 2017-09-14 DIAGNOSIS — F322 Major depressive disorder, single episode, severe without psychotic features: Secondary | ICD-10-CM | POA: Diagnosis not present

## 2017-09-14 MED ORDER — DULOXETINE HCL 60 MG PO CPEP: 60.0000 mg | ORAL_CAPSULE | Freq: Two times a day (BID) | ORAL | 2 refills | Status: DC

## 2017-09-14 MED ORDER — TRAZODONE HCL 150 MG PO TABS: 150.0000 mg | ORAL_TABLET | Freq: Every day | ORAL | 2 refills | Status: DC

## 2017-09-14 NOTE — Progress Notes (Signed)
[facility] MD/PA/NP OP Progress Note  09/14/2017 9:44 AM Victoria Mcdowell  MRN:  [id number]  Chief Complaint:  Chief Complaint    Depression; Anxiety; Follow-up     HPI: this patient is a 52 year old white female who is married to her female partner and lives with her 3 sons  in [place]. 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  The patient was referred by her primary physician, Dr. Teryl Lucy for further assessment of depression.  The patient states that she had a bad bout of depression in the late 90s. She saw a psychiatrist and was actually hospitalized in 2000 after she became suicidal. She was somewhat better on a combination of Paxil and Wellbutrin. She stayed on medications for a couple of years but then went back to college and lost her insurance and went off of them. She met her current wife and they decided to have children and the wife had artificial insemination to have the 3 children.  In the interim the patient completed her college degree in animal biology. 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 She since then her back is never really been right in she is in chronic pain and not able to work. She also has to be on oxygen because of sleep difficulties and she is scheduled to have a sleep study. She states that her if makes fun of all these problems that she has and seems to resent her. The wife now works full time in the patient stays at home and apparently is not happy with the situation. They have not been sexually intimate for over a year and did not sleep in the same bed. She feels rejected and unloved. She also feels like a failure due to her medical problems and inability to work.  Over the last couple of years the patient's depressive symptoms have resurfaced. She cries all the time, has no energy he doesn't enjoy anything in her life and is snappy and  irritable with the children. She has had thoughts of "I would rather be dead." She claims however that she would never hurt her self. She denies psychotic symptoms and does not use substances  She returns after 5 months.  She states that she has been having more stress lately.  She and her wife are having a lot of conflicts regarding family issues.  Her wife is threatened to leave and they are both about to start counseling.  She finds that she is irritable quite a bit and gets angered easily at her children.  She still struggling with chronic back pain.  She is also trying to get her diabetes under better control and her last A1c was down from 15-10.  She still feels like her medications have been helpful and she denies suicidal ideation.  She has good friends that she can talk to is going to start individual counseling soon. Visit Diagnosis:    ICD-10-CM   1. Major depressive disorder, single episode, severe without psychotic features ([place]) F32.2     Past Psychiatric History: Hospitalization in 2000 for depression  Past Medical History:  Past Medical History:  Diagnosis Date  . Depression   . Diabetes mellitus, type II ([place])   . Elevated cholesterol     Past Surgical History:  Procedure Laterality Date  . BREAST BIOPSY Left 2016  . CARPAL TUNNEL RELEASE    .  KNEE SURGERY    . SPINE SURGERY    . ulner nerve release      Family Psychiatric History: See below  Family History:  Family History  Problem Relation Age of Onset  . Depression Mother   . Hypertension Mother   . Depression Father   . Cancer Father   . Diabetes Father   . Breast cancer Neg Hx     Social History:  Social History   Socioeconomic History  . Marital status: Married    Spouse name: None  . Number of children: None  . Years of education: None  . Highest education level: None  Social Needs  . Financial resource strain: None  . Food insecurity - worry: None  . Food insecurity - inability: None  .  Transportation needs - medical: None  . Transportation needs - non-medical: None  Occupational History  . None  Tobacco Use  . Smoking status: Never Smoker  . Smokeless tobacco: Never Used  Substance and Sexual Activity  . Alcohol use: No  . Drug use: No  . Sexual activity: Not Currently  Other Topics Concern  . None  Social History Narrative  . None    Allergies:  Allergies  Allergen Reactions  . Biaxin [Clarithromycin] Swelling and Other (See Comments)    Reaction:  Facial swelling   . Trulicity [Dulaglutide]     Metabolic Disorder Labs: Lab Results  Component Value Date   HGBA1C 10.6 (H) 05/20/2017   MPG 258 05/20/2017   No results found for: PROLACTIN Lab Results  Component Value Date   CHOL 166 05/20/2017   TRIG 132 05/20/2017   HDL 52 05/20/2017   CHOLHDL 3.2 05/20/2017   Lab Results  Component Value Date   TSH 1.22 05/20/2017   TSH 1.30 02/10/2017    Therapeutic Level Labs: No results found for: LITHIUM No results found for: VALPROATE No components found for:  CBMZ  Current Medications: Current Outpatient Medications  Medication Sig Dispense Refill  . atorvastatin (LIPITOR) 20 MG tablet TAKE 1 TABLET BY MOUTH DAILY 30 tablet 3  . baclofen (LIORESAL) 10 MG tablet Take 10 mg by mouth 3 (three) times daily as needed for muscle spasms.     . Blood Glucose Monitoring Suppl (ONETOUCH VERIO) w/Device KIT 1 each by Does not apply route as needed. 1 kit 0  . Cholecalciferol (VITAMIN D3) 5000 units CAPS TAKE 1 CAPSULE BY MOUTH DAILY 90 capsule 0  . Dulaglutide (TRULICITY) 5.03 UU/8.2CM SOPN Inject 0.75 mg into the skin once a week. 4 pen 2  . DULoxetine (CYMBALTA) 60 MG capsule Take 1 capsule (60 mg total) by mouth 2 (two) times daily. 180 capsule 2  . gabapentin (NEURONTIN) 600 MG tablet Take 600 mg by mouth 3 (three) times daily.    . insulin NPH-regular Human (HUMULIN 70/30) (70-30) 100 UNIT/ML injection Inject 40 units with breakfast and 40 units with  supper when pre-meal blood glucose is above 90 mg/dL. 3 vial 2  . insulin NPH-regular Human (HUMULIN 70/30) (70-30) 100 UNIT/ML injection Inject 40 Units into the skin 2 (two) times daily with a meal. 50 mL 0  . INSULIN SYRINGE .5CC/28G 28G X 1/2" 0.5 ML MISC 1 Units by Does not apply route 2 (two) times daily. 100 each 3  . Lancets MISC 1 each by Does not apply route as directed. 150 each 3  . metFORMIN (GLUCOPHAGE) 1000 MG tablet Take 1 tablet (1,000 mg total) by mouth 2 (two) times daily  with a meal. 60 tablet 2  . oxyCODONE-acetaminophen (PERCOCET) 10-325 MG per tablet Take 1 tablet by mouth every 6 (six) hours as needed for pain.    . traZODone (DESYREL) 150 MG tablet Take 1 tablet (150 mg total) by mouth at bedtime. 90 tablet 2   No current facility-administered medications for this visit.      Musculoskeletal: Strength & Muscle Tone: within normal limits Gait & Station: normal Patient leans: N/A  Psychiatric Specialty Exam: Review of Systems  Musculoskeletal: Positive for back pain.  All other systems reviewed and are negative.   Blood pressure 120/75, pulse (!) 116, height '5\' 7"'  (1.702 m), weight 192 lb (87.1 kg), last menstrual period 12/25/2014, SpO2 97 %.Body mass index is 30.07 kg/m.  General Appearance: Casual and Fairly Groomed  Eye Contact:  Good  Speech:  Clear and Coherent  Volume:  Normal  Mood:  Dysphoric  Affect:  Constricted  Thought Process:  Goal Directed  Orientation:  Full (Time, Place, and Person)  Thought Content: Rumination   Suicidal Thoughts:  No  Homicidal Thoughts:  No  Memory:  Immediate;   Good Recent;   Good Remote;   Good  Judgement:  Good  Insight:  Good  Psychomotor Activity:  Decreased  Concentration:  Concentration: Good and Attention Span: Good  Recall:  Good  Fund of Knowledge: Good  Language: Good  Akathisia:  No  Handed:  Right  AIMS (if indicated): not done  Assets:  Communication Skills Desire for  Improvement Resilience Social Support Talents/Skills  ADL's:  Intact  Cognition: WNL  Sleep:  Fair   Screenings: PHQ2-9     Office Visit from 06/02/2017 in [facility] Office Visit from 04/12/2017 in [facility]  PHQ-2 Total Score  0  0       Assessment and Plan: She is a 52 year old female with a history of depression and anxiety.  She is going to pursue more counseling for both herself and perhaps as a couple.  For now she will continue trazodone 150 mg at bedtime for sleep and Cymbalta 60 mg twice a day for depression and chronic pain.  She will return to see me in 3 months   Victoria Spiller, MD 09/14/2017, 9:44 AM

## 2017-09-19 ENCOUNTER — Other Ambulatory Visit: Payer: Self-pay | Admitting: "Endocrinology

## 2017-10-03 ENCOUNTER — Ambulatory Visit: Payer: 59 | Admitting: "Endocrinology

## 2017-10-03 ENCOUNTER — Ambulatory Visit: Payer: Self-pay | Admitting: Nutrition

## 2017-10-08 LAB — COMPLETE METABOLIC PANEL WITH GFR
AG RATIO: 1.5 (calc) (ref 1.0–2.5)
ALBUMIN MSPROF: 4.1 g/dL (ref 3.6–5.1)
ALT: 16 U/L (ref 6–29)
AST: 10 U/L (ref 10–35)
Alkaline phosphatase (APISO): 69 U/L (ref 33–130)
BUN: 17 mg/dL (ref 7–25)
CALCIUM: 9.5 mg/dL (ref 8.6–10.4)
CO2: 30 mmol/L (ref 20–32)
CREATININE: 0.71 mg/dL (ref 0.50–1.05)
Chloride: 101 mmol/L (ref 98–110)
GFR, EST AFRICAN AMERICAN: 113 mL/min/{1.73_m2} (ref >=60)
GFR, EST NON AFRICAN AMERICAN: 97 mL/min/{1.73_m2} (ref >=60)
GLOBULIN: 2.7 g/dL (ref 1.9–3.7)
Glucose, Bld: 312 mg/dL — ABNORMAL HIGH (ref 65–99)
Potassium: 5 mmol/L (ref 3.5–5.3)
SODIUM: 139 mmol/L (ref 135–146)
Total Bilirubin: 0.4 mg/dL (ref 0.2–1.2)
Total Protein: 6.8 g/dL (ref 6.1–8.1)

## 2017-10-08 LAB — HEMOGLOBIN A1C
Hgb A1c MFr Bld: 11.5 % of total Hgb — ABNORMAL HIGH (ref <5.7)
Mean Plasma Glucose: 283 (calc)
eAG (mmol/L): 15.7 (calc)

## 2017-10-12 ENCOUNTER — Encounter: Payer: Self-pay | Admitting: "Endocrinology

## 2017-10-12 ENCOUNTER — Ambulatory Visit (INDEPENDENT_AMBULATORY_CARE_PROVIDER_SITE_OTHER): Payer: 59 | Admitting: "Endocrinology

## 2017-10-12 VITALS — BP 121/78 | HR 116 | Ht 66.0 in | Wt 195.0 lb

## 2017-10-12 DIAGNOSIS — Z794 Long term (current) use of insulin: Secondary | ICD-10-CM

## 2017-10-12 DIAGNOSIS — E118 Type 2 diabetes mellitus with unspecified complications: Secondary | ICD-10-CM

## 2017-10-12 DIAGNOSIS — E782 Mixed hyperlipidemia: Secondary | ICD-10-CM | POA: Diagnosis not present

## 2017-10-12 DIAGNOSIS — E1165 Type 2 diabetes mellitus with hyperglycemia: Secondary | ICD-10-CM | POA: Diagnosis not present

## 2017-10-12 DIAGNOSIS — IMO0002 Reserved for concepts with insufficient information to code with codable children: Secondary | ICD-10-CM

## 2017-10-12 MED ORDER — INSULIN PEN NEEDLE 31G X 8 MM MISC: 1.0000 | 3 refills | Status: DC

## 2017-10-12 MED ORDER — INSULIN LISPRO 100 UNIT/ML (KWIKPEN): 10.0000 [IU] | PEN_INJECTOR | Freq: Three times a day (TID) | SUBCUTANEOUS | 2 refills | Status: DC

## 2017-10-12 MED ORDER — ATORVASTATIN CALCIUM 20 MG PO TABS: 20.0000 mg | ORAL_TABLET | Freq: Every day | ORAL | 3 refills | Status: DC

## 2017-10-12 MED ORDER — INSULIN DEGLUDEC 200 UNIT/ML ~~LOC~~ SOPN: 60.0000 [IU] | PEN_INJECTOR | Freq: Every day | SUBCUTANEOUS | 2 refills | Status: DC

## 2017-10-12 NOTE — Progress Notes (Signed)
Subjective:    Patient ID: Victoria Mcdowell, female    DOB: [birth year]. Patient is being seen in consultation for management of diabetes requested by  Paula Compton, MD  Past Medical History:  Diagnosis Date  . Depression   . Diabetes mellitus, type II ([place])   . Elevated cholesterol    Past Surgical History:  Procedure Laterality Date  . BREAST BIOPSY Left 2016  . CARPAL TUNNEL RELEASE    . KNEE SURGERY    . SPINE SURGERY    . ulner nerve release     Social History   Socioeconomic History  . Marital status: Married    Spouse name: None  . Number of children: None  . Years of education: None  . Highest education level: None  Social Needs  . Financial resource strain: None  . Food insecurity - worry: None  . Food insecurity - inability: None  . Transportation needs - medical: None  . Transportation needs - non-medical: None  Occupational History  . None  Tobacco Use  . Smoking status: Never Smoker  . Smokeless tobacco: Never Used  Substance and Sexual Activity  . Alcohol use: No  . Drug use: No  . Sexual activity: Not Currently  Other Topics Concern  . None  Social History Narrative  . None   Outpatient Encounter Medications as of 10/12/2017  Medication Sig  . atorvastatin (LIPITOR) 20 MG tablet TAKE 1 TABLET BY MOUTH DAILY  . baclofen (LIORESAL) 10 MG tablet Take 10 mg by mouth 3 (three) times daily as needed for muscle spasms.   . Blood Glucose Monitoring Suppl (ONETOUCH VERIO) w/Device KIT 1 each by Does not apply route as needed.  . Cholecalciferol (VITAMIN D3) 5000 units CAPS TAKE 1 CAPSULE BY MOUTH DAILY  . DULoxetine (CYMBALTA) 60 MG capsule Take 1 capsule (60 mg total) by mouth 2 (two) times daily.  [place] Kitchen gabapentin (NEURONTIN) 600 MG tablet Take 600 mg by mouth 3 (three) times daily.  . Insulin Degludec (TRESIBA FLEXTOUCH) 200 UNIT/ML SOPN Inject 60 Units into the skin at bedtime.  . insulin lispro (HUMALOG KWIKPEN) 100 UNIT/ML KiwkPen Inject 0.1-0.16 mLs  (10-16 Units total) into the skin 3 (three) times daily between meals.  . Insulin Pen Needle (B-D ULTRAFINE III SHORT PEN) 31G X 8 MM MISC 1 each by Does not apply route as directed.  . Lancets MISC 1 each by Does not apply route as directed.  . metFORMIN (GLUCOPHAGE) 1000 MG tablet Take 1 tablet (1,000 mg total) by mouth 2 (two) times daily with a meal.  . oxyCODONE-acetaminophen (PERCOCET) 10-325 MG per tablet Take 1 tablet by mouth every 6 (six) hours as needed for pain.  . traZODone (DESYREL) 150 MG tablet Take 1 tablet (150 mg total) by mouth at bedtime.  . [DISCONTINUED] Dulaglutide (TRULICITY) 0.94 BS/9.6GE SOPN Inject 0.75 mg into the skin once a week.  . [DISCONTINUED] HUMULIN 70/30 (70-30) 100 UNIT/ML injection INJECT 40 UNITS INTO THE SKIN 2 TIMES DAILY WITH A MEAL  . [DISCONTINUED] insulin NPH-regular Human (HUMULIN 70/30) (70-30) 100 UNIT/ML injection Inject 40 units with breakfast and 40 units with supper when pre-meal blood glucose is above 90 mg/dL.  . [DISCONTINUED] INSULIN SYRINGE .5CC/28G 28G X 1/2" 0.5 ML MISC 1 Units by Does not apply route 2 (two) times daily.   No facility-administered encounter medications on file as of 10/12/2017.    ALLERGIES: Allergies  Allergen Reactions  . Biaxin [Clarithromycin] Swelling and Other (See Comments)  Reaction:  Facial swelling   . Trulicity [Dulaglutide]    VACCINATION STATUS:  There is no immunization history on file for this patient.  Diabetes  She presents for her follow-up diabetic visit. She has type 2 diabetes mellitus. Her disease course has been worsening. There are no hypoglycemic associated symptoms. Pertinent negatives for hypoglycemia include no confusion, headaches, pallor or seizures. Associated symptoms include blurred vision, fatigue, foot paresthesias, polydipsia and polyuria. Pertinent negatives for diabetes include no chest pain and no polyphagia. There are no hypoglycemic complications. Symptoms are worsening.  Diabetic complications include peripheral neuropathy. Risk factors for coronary artery disease include family history, dyslipidemia, diabetes mellitus, hypertension and sedentary lifestyle. Current diabetic treatment includes insulin injections (She says she was put on Humulin 70/30-55 units twice a day, however she admits she has not been consistent taking this much insulin.). Her weight is stable. She is following a generally unhealthy diet. When asked about meal planning, she reported none. She has not had a previous visit with a dietitian. She participates in exercise intermittently. Her home blood glucose trend is decreasing steadily. Her overall blood glucose range is >200 mg/dl. (She brought in meter showing 7 readings, 12 readings in the last 14 days averaging 267, 33 readings in the last 30 days averaging 275, 66 readings in the last 90 days averaging 273.  However she claims to have been monitoring blood glucose 4 times a day and documented suspicious looking blood glucose readings in her log as such.) An ACE inhibitor/angiotensin II receptor blocker is not being taken. Eye exam is current (She denies any retinopathy.).  Hyperlipidemia  This is a chronic problem. The current episode started more than 1 year ago. The problem is uncontrolled. Recent lipid tests were reviewed and are high. Exacerbating diseases include diabetes and obesity. Pertinent negatives include no chest pain, myalgias or shortness of breath. Current antihyperlipidemic treatment includes statins. Risk factors for coronary artery disease include dyslipidemia, diabetes mellitus, family history, hypertension, a sedentary lifestyle and obesity.  Hypertension  This is a chronic problem. The current episode started more than 1 year ago. The problem is controlled. Associated symptoms include blurred vision. Pertinent negatives include no chest pain, headaches, palpitations or shortness of breath. Risk factors for coronary artery disease  include diabetes mellitus, dyslipidemia, obesity, sedentary lifestyle and post-menopausal state. Past treatments include nothing.     Review of Systems  Constitutional: Positive for fatigue. Negative for chills, fever and unexpected weight change.  HENT: Negative for trouble swallowing and voice change.   Eyes: Positive for blurred vision. Negative for visual disturbance.  Respiratory: Negative for cough, shortness of breath and wheezing.   Cardiovascular: Negative for chest pain, palpitations and leg swelling.  Gastrointestinal: Negative for diarrhea, nausea and vomiting.  Endocrine: Positive for polydipsia and polyuria. Negative for cold intolerance, heat intolerance and polyphagia.  Musculoskeletal: Negative for arthralgias and myalgias.  Skin: Negative for color change, pallor, rash and wound.  Neurological: Negative for seizures and headaches.  Psychiatric/Behavioral: Negative for confusion and suicidal ideas.    Objective:    BP 121/78   Pulse (!) 116   Ht '5\' 6"'  (1.676 m)   Wt 195 lb (88.5 kg)   LMP 12/25/2014   BMI 31.47 kg/m   Wt Readings from Last 3 Encounters:  10/12/17 195 lb (88.5 kg)  08/04/17 196 lb (88.9 kg)  06/02/17 188 lb (85.3 kg)    Physical Exam  Constitutional: She is oriented to person, place, and time. She appears well-developed.  HENT:  Head: Normocephalic and atraumatic.  Dry mucous membranes.  Eyes: EOM are normal.  Neck: Normal range of motion. Neck supple. No tracheal deviation present. No thyromegaly present.  Cardiovascular: Normal rate and regular rhythm.  Pulmonary/Chest: Effort normal and breath sounds normal.  Abdominal: Soft. Bowel sounds are normal. There is no tenderness. There is no guarding.  Musculoskeletal: Normal range of motion. She exhibits no edema.  Neurological: She is alert and oriented to person, place, and time. She has normal reflexes. No cranial nerve deficit. Coordination normal.  Skin: Skin is warm and dry. No rash  noted. No erythema. No pallor.  Psychiatric: She has a normal mood and affect.  Reluctant and unconcerned affect.    CMP     Component Value Date/Time   NA 139 10/07/2017 0947   K 5.0 10/07/2017 0947   CL 101 10/07/2017 0947   CO2 30 10/07/2017 0947   GLUCOSE 312 (H) 10/07/2017 0947   BUN 17 10/07/2017 0947   BUN 15 02/10/2017   CREATININE 0.71 10/07/2017 0947   CALCIUM 9.5 10/07/2017 0947   PROT 6.8 10/07/2017 0947   AST 10 10/07/2017 0947   ALT 16 10/07/2017 0947   BILITOT 0.4 10/07/2017 0947   GFRNONAA 97 10/07/2017 0947   GFRAA 113 10/07/2017 0947   Diabetic Labs (most recent): Lab Results  Component Value Date   HGBA1C 11.5 (H) 10/07/2017   HGBA1C 10.6 (H) 05/20/2017   HGBA1C 15.5 02/10/2017     Lipid Panel ( most recent) Lipid Panel     Component Value Date/Time   CHOL 166 05/20/2017 1112   TRIG 132 05/20/2017 1112   HDL 52 05/20/2017 1112   CHOLHDL 3.2 05/20/2017 1112     Assessment & Plan:   1. Uncontrolled type 2 diabetes mellitus with complication, with long-term current use of insulin ([place])  - Patient has currently uncontrolled symptomatic type 2 DM since  52 years of age. -She came with incomplete logs, meter shows blood glucose reading 1 -2 times a day, her A1c worsening at 11.5% from 10.6%. -She did have A1c>15.5% % after her last visit.   Recent labs reviewed.   Her diabetes is complicated by  peripheral neuropathy, depression and patient remains at a high risk for more acute and chronic complications which include CAD, CVA, CKD, retinopathy, and neuropathy. These are all discussed in detail with the patient.  - I have counseled the patient on diet management and weight loss, by adopting a carbohydrate restricted/protein rich diet.  -  Suggestion is made for her to avoid simple carbohydrates  from her diet including Cakes, Sweet Desserts / Pastries, Ice Cream, Soda (diet and regular), Sweet Tea, Candies, Chips, Cookies, Store Bought Juices,  Alcohol in Excess of  1-2 drinks a day, Artificial Sweeteners, and "Sugar-free" Products. This will help patient to have stable blood glucose profile and potentially avoid unintended weight gain.  - I encouraged the patient to switch to  unprocessed or minimally processed complex starch and increased protein intake (animal or plant source), fruits, and vegetables.  - Patient is advised to stick to a routine mealtimes to eat 3 meals  a day and avoid unnecessary snacks ( to snack only to correct hypoglycemia).  - I have approached patient with the following individualized plan to manage diabetes and patient agrees:   -  Based on her current glycemic burden she will need intensive treatment with basal/bolus insulin.  -Her current premixed insulin Humulin 70/30 will not help her achieve  control she needs.  -I have discussed and initiated Tresiba 60 units nightly, Humalog 10 units 3 times daily before meals for pre-meal blood glucose above 90 mg/dL with additional correction for readings above 150 mg/dL, associated with strict monitoring of blood glucose 4 times a day-before meals and at bedtime and return in 1 week with her meter and logs for reevaluation and insulin dose adjustment.  -Samples of Tresiba and Humalog provided from clinic today. - Patient is warned not to take insulin without proper monitoring per orders.  -Patient is encouraged to call clinic for blood glucose levels less than 70 or above 300 mg /dl. - I will continue metformin  1000 mg by mouth twice a day , therapeutically suitable for patient . -She did not tolerate Trulicity, I advised her to discontinue.  -She is also advised to discontinue Humulin 70/30. - Patient specific target  A1c;  LDL, HDL, Triglycerides, and  Waist Circumference were discussed in detail.  2) BP/HTN: Her blood pressure is controlled to target.  I advised her to continue continue current medications including ACEI/ARB. 3) Lipids/HPL:   controlled with  triglycerides improving to 132 from  404, total cholesterol   improving to 166 from 293, HDL 52, and LDL 91 .   she is tolerating atorvastatin 20 mg by mouth daily at bedtime side effects and precautions discussed with her.   4)  Weight/Diet: CDE Consult has been initiated , exercise, and detailed carbohydrates information provided.  5) vitamin D deficiency:  -   She was recently initiated on  vitamin D 3 5000 units daily for the next 90 days, she is advised to finish.  6) Chronic Care/Health Maintenance:  -Patient is not on  ACEI/ARB and  started Statin medications and encouraged to continue to follow up with Ophthalmology, Podiatrist at least yearly or according to recommendations, and advised to   stay away from smoking. I have recommended yearly flu vaccine and pneumonia vaccination at least every 5 years; moderate intensity exercise for up to 150 minutes weekly; and  sleep for at least 7 hours a day.  - Time spent with the patient: 25 min, of which >50% was spent in reviewing her blood glucose logs , discussing her hypo- and hyper-glycemic episodes, reviewing her current and  previous labs and insulin doses and developing a plan to avoid hypo- and hyper-glycemia. Please refer to Patient Instructions for Blood Glucose Monitoring and Insulin/Medications Dosing Guide"  in media tab for additional information.   - I advised patient to maintain close follow up with Paula Compton, MD for primary care needs.  Follow up plan: - Return in about 2 weeks (around 10/26/2017) for follow up with meter and logs- no labs.  Glade Lloyd, MD Phone: [phone]  Fax: [phone]   This note was partially dictated with voice recognition software. Similar sounding words can be transcribed inadequately or may not  be corrected upon review.  10/12/2017, 5:12 PM

## 2017-10-12 NOTE — Patient Instructions (Signed)

## 2017-10-13 ENCOUNTER — Other Ambulatory Visit: Payer: Self-pay

## 2017-10-13 MED ORDER — INSULIN GLARGINE 300 UNIT/ML ~~LOC~~ SOPN: 60.0000 [IU] | PEN_INJECTOR | Freq: Every day | SUBCUTANEOUS | 2 refills | Status: DC

## 2017-10-13 MED ORDER — INSULIN ASPART 100 UNIT/ML FLEXPEN: 10.0000 [IU] | PEN_INJECTOR | Freq: Three times a day (TID) | SUBCUTANEOUS | 2 refills | Status: DC

## 2017-10-17 ENCOUNTER — Other Ambulatory Visit: Payer: Self-pay | Admitting: "Endocrinology

## 2017-10-21 LAB — HM DIABETES EYE EXAM

## 2017-10-22 ENCOUNTER — Other Ambulatory Visit: Payer: Self-pay | Admitting: "Endocrinology

## 2017-10-26 ENCOUNTER — Ambulatory Visit (INDEPENDENT_AMBULATORY_CARE_PROVIDER_SITE_OTHER): Payer: 59 | Admitting: "Endocrinology

## 2017-10-26 ENCOUNTER — Encounter: Payer: Self-pay | Admitting: "Endocrinology

## 2017-10-26 VITALS — BP 112/66 | HR 62 | Ht 66.0 in | Wt 200.0 lb

## 2017-10-26 DIAGNOSIS — E118 Type 2 diabetes mellitus with unspecified complications: Secondary | ICD-10-CM | POA: Diagnosis not present

## 2017-10-26 DIAGNOSIS — Z794 Long term (current) use of insulin: Secondary | ICD-10-CM

## 2017-10-26 DIAGNOSIS — E559 Vitamin D deficiency, unspecified: Secondary | ICD-10-CM

## 2017-10-26 DIAGNOSIS — E782 Mixed hyperlipidemia: Secondary | ICD-10-CM

## 2017-10-26 DIAGNOSIS — IMO0002 Reserved for concepts with insufficient information to code with codable children: Secondary | ICD-10-CM

## 2017-10-26 DIAGNOSIS — E1165 Type 2 diabetes mellitus with hyperglycemia: Secondary | ICD-10-CM | POA: Diagnosis not present

## 2017-10-26 MED ORDER — INSULIN ASPART 100 UNIT/ML FLEXPEN: 15.0000 [IU] | PEN_INJECTOR | Freq: Three times a day (TID) | SUBCUTANEOUS | 2 refills | Status: DC

## 2017-10-26 NOTE — Progress Notes (Signed)
Subjective:    Patient ID: Victoria Mcdowell, female    DOB: 08/10/1966. Patient is being seen in follow-up for management of diabetes requested by  Paula Compton, MD  Past Medical History:  Diagnosis Date  . Depression   . Diabetes mellitus, type II ([place])   . Elevated cholesterol    Past Surgical History:  Procedure Laterality Date  . BREAST BIOPSY Left 2016  . CARPAL TUNNEL RELEASE    . KNEE SURGERY    . SPINE SURGERY    . ulner nerve release     Social History   Socioeconomic History  . Marital status: Married    Spouse name: None  . Number of children: None  . Years of education: None  . Highest education level: None  Social Needs  . Financial resource strain: None  . Food insecurity - worry: None  . Food insecurity - inability: None  . Transportation needs - medical: None  . Transportation needs - non-medical: None  Occupational History  . None  Tobacco Use  . Smoking status: Never Smoker  . Smokeless tobacco: Never Used  Substance and Sexual Activity  . Alcohol use: No  . Drug use: No  . Sexual activity: Not Currently  Other Topics Concern  . None  Social History Narrative  . None   Outpatient Encounter Medications as of 10/26/2017  Medication Sig  . atorvastatin (LIPITOR) 20 MG tablet Take 1 tablet (20 mg total) by mouth daily.  . baclofen (LIORESAL) 10 MG tablet Take 10 mg by mouth 3 (three) times daily as needed for muscle spasms.   . Blood Glucose Monitoring Suppl (ONETOUCH VERIO) w/Device KIT 1 each by Does not apply route as needed.  . Cholecalciferol (VITAMIN D3) 5000 units CAPS TAKE 1 CAPSULE BY MOUTH EVERY DAY  . DULoxetine (CYMBALTA) 60 MG capsule Take 1 capsule (60 mg total) by mouth 2 (two) times daily.  Marland Kitchen gabapentin (NEURONTIN) 600 MG tablet Take 600 mg by mouth 3 (three) times daily.  . insulin aspart (NOVOLOG) 100 UNIT/ML FlexPen Inject 15-21 Units into the skin 3 (three) times daily with meals.  . Insulin Glargine 300 UNIT/ML SOPN Inject  60 Units into the skin at bedtime.  . Insulin Pen Needle (B-D ULTRAFINE III SHORT PEN) 31G X 8 MM MISC 1 each by Does not apply route as directed.  . Lancets MISC 1 each by Does not apply route as directed.  . metFORMIN (GLUCOPHAGE) 1000 MG tablet Take 1 tablet (1,000 mg total) by mouth 2 (two) times daily with a meal.  . oxyCODONE-acetaminophen (PERCOCET) 10-325 MG per tablet Take 1 tablet by mouth every 6 (six) hours as needed for pain.  . traZODone (DESYREL) 150 MG tablet Take 1 tablet (150 mg total) by mouth at bedtime.  . [DISCONTINUED] insulin aspart (NOVOLOG) 100 UNIT/ML FlexPen Inject 10-16 Units into the skin 3 (three) times daily with meals.  . [DISCONTINUED] Insulin Degludec (TRESIBA FLEXTOUCH) 200 UNIT/ML SOPN Inject 60 Units into the skin at bedtime.  . [DISCONTINUED] insulin lispro (HUMALOG KWIKPEN) 100 UNIT/ML KiwkPen Inject 0.1-0.16 mLs (10-16 Units total) into the skin 3 (three) times daily between meals.  . [DISCONTINUED] metFORMIN (GLUCOPHAGE) 1000 MG tablet TAKE 1 TABLET BY MOUTH TWICE DAILY WITH MEALS   No facility-administered encounter medications on file as of 10/26/2017.    ALLERGIES: Allergies  Allergen Reactions  . Biaxin [Clarithromycin] Swelling and Other (See Comments)    Reaction:  Facial swelling   . Trulicity [Dulaglutide]  VACCINATION STATUS:  There is no immunization history on file for this patient.  Diabetes  She presents for her follow-up diabetic visit. She has type 2 diabetes mellitus. Her disease course has been improving. There are no hypoglycemic associated symptoms. Pertinent negatives for hypoglycemia include no confusion, headaches, pallor or seizures. Associated symptoms include blurred vision, fatigue, foot paresthesias, polydipsia and polyuria. Pertinent negatives for diabetes include no chest pain and no polyphagia. There are no hypoglycemic complications. Symptoms are improving. Diabetic complications include peripheral neuropathy. Risk  factors for coronary artery disease include family history, dyslipidemia, diabetes mellitus, hypertension and sedentary lifestyle. Current diabetic treatment includes insulin injections (She says she was put on Humulin 70/30-55 units twice a day, however she admits she has not been consistent taking this much insulin.). Her weight is increasing steadily. She is following a generally unhealthy diet. When asked about meal planning, she reported none. She has not had a previous visit with a dietitian. She participates in exercise intermittently. Her home blood glucose trend is decreasing steadily. Her breakfast blood glucose range is generally 140-180 mg/dl. Her lunch blood glucose range is generally 180-200 mg/dl. Her dinner blood glucose range is generally 180-200 mg/dl. Her bedtime blood glucose range is generally 180-200 mg/dl. Her overall blood glucose range is 180-200 mg/dl. An ACE inhibitor/angiotensin II receptor blocker is not being taken. Eye exam is current (She denies any retinopathy.).  Hyperlipidemia  This is a chronic problem. The current episode started more than 1 year ago. The problem is uncontrolled. Recent lipid tests were reviewed and are high. Exacerbating diseases include diabetes and obesity. Pertinent negatives include no chest pain, myalgias or shortness of breath. Current antihyperlipidemic treatment includes statins. Risk factors for coronary artery disease include dyslipidemia, diabetes mellitus, family history, hypertension, a sedentary lifestyle and obesity.  Hypertension  This is a chronic problem. The current episode started more than 1 year ago. The problem is controlled. Associated symptoms include blurred vision. Pertinent negatives include no chest pain, headaches, palpitations or shortness of breath. Risk factors for coronary artery disease include diabetes mellitus, dyslipidemia, obesity, sedentary lifestyle and post-menopausal state. Past treatments include nothing.      Review of Systems  Constitutional: Positive for fatigue. Negative for chills, fever and unexpected weight change.  HENT: Negative for trouble swallowing and voice change.   Eyes: Positive for blurred vision. Negative for visual disturbance.  Respiratory: Negative for cough, shortness of breath and wheezing.   Cardiovascular: Negative for chest pain, palpitations and leg swelling.  Gastrointestinal: Negative for diarrhea, nausea and vomiting.  Endocrine: Positive for polydipsia and polyuria. Negative for cold intolerance, heat intolerance and polyphagia.  Musculoskeletal: Negative for arthralgias and myalgias.  Skin: Negative for color change, pallor, rash and wound.  Neurological: Negative for seizures and headaches.  Psychiatric/Behavioral: Negative for confusion and suicidal ideas.    Objective:    BP 112/66   Pulse 62   Ht 5' 6" (1.676 m)   Wt 200 lb (90.7 kg)   LMP 12/25/2014   BMI 32.28 kg/m   Wt Readings from Last 3 Encounters:  10/26/17 200 lb (90.7 kg)  10/12/17 195 lb (88.5 kg)  08/04/17 196 lb (88.9 kg)    Physical Exam  Constitutional: She is oriented to person, place, and time. She appears well-developed.  HENT:  Head: Normocephalic and atraumatic.  Dry mucous membranes.  Eyes: EOM are normal.  Neck: Normal range of motion. Neck supple. No tracheal deviation present. No thyromegaly present.  Cardiovascular: Normal rate and regular  rhythm.  Pulmonary/Chest: Effort normal and breath sounds normal.  Abdominal: Soft. Bowel sounds are normal. There is no tenderness. There is no guarding.  Musculoskeletal: Normal range of motion. She exhibits no edema.  Neurological: She is alert and oriented to person, place, and time. She has normal reflexes. No cranial nerve deficit. Coordination normal.  Skin: Skin is warm and dry. No rash noted. No erythema. No pallor.  Psychiatric: She has a normal mood and affect.    CMP     Component Value Date/Time   NA 139  10/07/2017 0947   K 5.0 10/07/2017 0947   CL 101 10/07/2017 0947   CO2 30 10/07/2017 0947   GLUCOSE 312 (H) 10/07/2017 0947   BUN 17 10/07/2017 0947   BUN 15 02/10/2017   CREATININE 0.71 10/07/2017 0947   CALCIUM 9.5 10/07/2017 0947   PROT 6.8 10/07/2017 0947   AST 10 10/07/2017 0947   ALT 16 10/07/2017 0947   BILITOT 0.4 10/07/2017 0947   GFRNONAA 97 10/07/2017 0947   GFRAA 113 10/07/2017 0947   Diabetic Labs (most recent): Lab Results  Component Value Date   HGBA1C 11.5 (H) 10/07/2017   HGBA1C 10.6 (H) 05/20/2017   HGBA1C 15.5 02/10/2017     Lipid Panel ( most recent) Lipid Panel     Component Value Date/Time   CHOL 166 05/20/2017 1112   TRIG 132 05/20/2017 1112   HDL 52 05/20/2017 1112   CHOLHDL 3.2 05/20/2017 1112     Assessment & Plan:   1. Uncontrolled type 2 diabetes mellitus with complication, with long-term current use of insulin ([place])  - Patient has currently uncontrolled symptomatic type 2 DM since  52 years of age. -She came with more complete logs, meter shows blood glucose reading improving at fasting, still significantly above target postprandial.  Her most recent A1c was 11.5% increasing from 10.6%.   -She did have A1c>15.5% % after her last visit.   Recent labs reviewed.   Her diabetes is complicated by  peripheral neuropathy, depression and patient remains at a high risk for more acute and chronic complications which include CAD, CVA, CKD, retinopathy, and neuropathy. These are all discussed in detail with the patient.  - I have counseled the patient on diet management and weight loss, by adopting a carbohydrate restricted/protein rich diet.  -  Suggestion is made for her to avoid simple carbohydrates  from her diet including Cakes, Sweet Desserts / Pastries, Ice Cream, Soda (diet and regular), Sweet Tea, Candies, Chips, Cookies, Store Bought Juices, Alcohol in Excess of  1-2 drinks a day, Artificial Sweeteners, and "Sugar-free" Products. This will  help patient to have stable blood glucose profile and potentially avoid unintended weight gain.  - I encouraged the patient to switch to  unprocessed or minimally processed complex starch and increased protein intake (animal or plant source), fruits, and vegetables.  - Patient is advised to stick to a routine mealtimes to eat 3 meals  a day and avoid unnecessary snacks ( to snack only to correct hypoglycemia).  - I have approached patient with the following individualized plan to manage diabetes and patient agrees:   -She has responded to intensive treatment with basal/bolus insulin.  -I have advised her to continue Toujeo 60 units nightly, increase NovoLog to 15 units 3 times daily before meals for pre-meal blood glucose above 90 mg/dL with additional correction for readings above 150 mg/dL, associated with strict monitoring of blood glucose 4 times a day-before meals and at bedtime .  -  Patient is warned not to take insulin without proper monitoring per orders.  -Patient is encouraged to call clinic for blood glucose levels less than 70 or above 300 mg /dl. - I will continue metformin  1000 mg by mouth twice a day , therapeutically suitable for patient . - Patient specific target  A1c;  LDL, HDL, Triglycerides, and  Waist Circumference were discussed in detail.  2) BP/HTN: Her blood pressure is controlled to target.  She is currently not on any antihypertensive medications.   3) Lipids/HPL:   controlled with triglycerides improving to 132 from  404, total cholesterol   improving to 166 from 293, HDL 52, and LDL 91 .   she is tolerating atorvastatin 20 mg by mouth daily at bedtime side effects and precautions discussed with her.   4)  Weight/Diet: CDE Consult has been initiated , exercise, and detailed carbohydrates information provided.  5) vitamin D deficiency:  -   She was recently initiated on  vitamin D 3 5000 units daily for the next 90 days, she is advised to finish.  6) Chronic  Care/Health Maintenance:  -Patient is not on  ACEI/ARB and  started Statin medications and encouraged to continue to follow up with Ophthalmology, Podiatrist at least yearly or according to recommendations, and advised to   stay away from smoking. I have recommended yearly flu vaccine and pneumonia vaccination at least every 5 years; moderate intensity exercise for up to 150 minutes weekly; and  sleep for at least 7 hours a day.  - Time spent with the patient: 25 min, of which >50% was spent in reviewing her blood glucose logs , discussing her hypo- and hyper-glycemic episodes, reviewing her current and  previous labs and insulin doses and developing a plan to avoid hypo- and hyper-glycemia. Please refer to Patient Instructions for Blood Glucose Monitoring and Insulin/Medications Dosing Guide"  in media tab for additional information.  - I advised patient to maintain close follow up with Paula Compton, MD for primary care needs.  Follow up plan: - Return in about 10 weeks (around 01/04/2018) for follow up with pre-visit labs, meter, and logs.  Glade Lloyd, MD Phone: [phone]  Fax: [phone]   This note was partially dictated with voice recognition software. Similar sounding words can be transcribed inadequately or may not  be corrected upon review.  10/26/2017, 4:27 PM

## 2017-10-26 NOTE — Patient Instructions (Signed)

## 2017-10-31 ENCOUNTER — Other Ambulatory Visit: Payer: Self-pay

## 2017-10-31 MED ORDER — BLOOD GLUCOSE MONITOR KIT: PACK | 2 refills | Status: AC

## 2017-11-01 ENCOUNTER — Other Ambulatory Visit: Payer: Self-pay

## 2017-11-02 ENCOUNTER — Other Ambulatory Visit: Payer: Self-pay

## 2017-11-02 MED ORDER — GLUCOSE BLOOD VI STRP: ORAL_STRIP | 5 refills | Status: AC

## 2017-11-08 ENCOUNTER — Encounter: Payer: Self-pay | Admitting: Nutrition

## 2017-11-08 ENCOUNTER — Encounter: Payer: 59 | Attending: Obstetrics and Gynecology | Admitting: Nutrition

## 2017-11-08 DIAGNOSIS — E1165 Type 2 diabetes mellitus with hyperglycemia: Secondary | ICD-10-CM | POA: Insufficient documentation

## 2017-11-08 DIAGNOSIS — E118 Type 2 diabetes mellitus with unspecified complications: Secondary | ICD-10-CM | POA: Insufficient documentation

## 2017-11-08 DIAGNOSIS — Z794 Long term (current) use of insulin: Secondary | ICD-10-CM | POA: Diagnosis not present

## 2017-11-08 DIAGNOSIS — Z713 Dietary counseling and surveillance: Secondary | ICD-10-CM | POA: Diagnosis not present

## 2017-11-08 NOTE — Progress Notes (Signed)
  Medical Nutrition Therapy:  Appt start time: 1130 end time:  1200.  Assessment:  Primary concerns today: Diabetes Type. Lives with her wife.   Forgot meter and log sheets today.  Novolog 15-16 units with meals;  60 units of Lantus at night.  FBS in am: 107-120's. Before lunch: 120-130's.  Before 100-180's and bedtime: 250's.Admits to some depression and is working with her PCP and therapist. She has made tremendous progress with blood sugars and testing 4 times per day .Cut out diet sodas. Has been avoiding snacks. Going to join PF today and start working out a few times per week. May be overeating carbs or insuffient carbs in  evening hours. Engaged and working on making better food choices.  Lab Results  Component Value Date   HGBA1C 11.5 (H) 10/07/2017   Preferred Learning Style:   No preference indicated   Learning Readiness:    Ready  Change in progress   MEDICATIONS: See list   DIETARY INTAKE:  24-hr recall:  B ( AM): 2  Rice cake and natural pb. And 8 1% milk.  Snk ( AM):  L ( PM): Nachos with roast vegetabes and cheese, water 12-15  Snk ( PM):  D ( PM): Pork chop, okra, and toast 1 slice, Unsweet tea or water Snk ( PM):  Beverages: water, milk,  Usual physical activity: Walks some-plans to join PF.  Estimated energy needs: 1500  calories 170  g carbohydrates 112 g protein 42 g fat  Progress Towards Goal(s):  In progress.   Nutritional Diagnosis:  NB-1.1 Food and nutrition-related knowledge deficit As related to Diabetes.  As evidenced by A1C 10.6%.    Intervention:  Nutrition and Diabetes education provided on My Plate, CHO counting, meal planning, portion sizes, timing of meals, avoiding snacks between meals unless having a low blood sugar, target ranges for A1C and blood sugars, signs/symptoms and treatment of hyper/hypoglycemia, monitoring blood sugars, taking medications as prescribed, benefits of exercising 30 minutes per day and prevention of  complications of DM. Marland Kitchen. Goals 1. Join PF and work out 150 minutes per week 2. Increase high fiber foods 3. Eat protein with all meals 4. Eat 2-3 carb choices (30-45 grams of carbs) per meal. 5. Get BS before bed less than 180 mg/dl. Don't skip meals Test blood sugars before meals and dose insulin and not after meals. Get A1C down to 7% or less. I will ask Dr[facility] Frans[facility]. Contact your insurance carrier to see if it is covered. Lose 1-2 lbs per week.   Teaching Method Utilized:  Visual Auditory Hands on  Handouts given during visit include:  The Plate Method   Meal Plan Card  Barriers to learning/adherence to lifestyle change:  none  Demonstrated degree of understanding via:  Teach Back   Monitoring/Evaluation:  Dietary intake, exercise,  Meal planing, , and body weight in 1 month(s).

## 2017-11-08 NOTE — Patient Instructions (Signed)
Goals 1. Join PF and work out 150 minutes per week 2. Increase high fiber foods 3. Eat protein with all meals 4. Eat 2-3 carb choices (30-45 grams of carbs) per meal. 5. Get BS before bed less than 180 mg/dl. Don't skip meals Test blood sugars before meals and dose insulin and not after meals. Get A1C down to 7% or less. I will ask Dr. [facility]. Contact your insurance carrier to see if it is covered. Lose 1-2 lbs per week.

## 2017-11-16 ENCOUNTER — Other Ambulatory Visit: Payer: Self-pay | Admitting: "Endocrinology

## 2017-12-13 ENCOUNTER — Ambulatory Visit (HOSPITAL_COMMUNITY): Payer: 59 | Admitting: Psychiatry

## 2017-12-13 ENCOUNTER — Encounter (HOSPITAL_COMMUNITY): Payer: Self-pay | Admitting: Psychiatry

## 2017-12-13 ENCOUNTER — Ambulatory Visit (INDEPENDENT_AMBULATORY_CARE_PROVIDER_SITE_OTHER): Payer: 59 | Admitting: Psychiatry

## 2017-12-13 VITALS — BP 105/69 | HR 100 | Ht 67.0 in | Wt 196.0 lb

## 2017-12-13 DIAGNOSIS — Z818 Family history of other mental and behavioral disorders: Secondary | ICD-10-CM | POA: Diagnosis not present

## 2017-12-13 DIAGNOSIS — G4700 Insomnia, unspecified: Secondary | ICD-10-CM

## 2017-12-13 DIAGNOSIS — M549 Dorsalgia, unspecified: Secondary | ICD-10-CM

## 2017-12-13 DIAGNOSIS — R45851 Suicidal ideations: Secondary | ICD-10-CM

## 2017-12-13 DIAGNOSIS — F322 Major depressive disorder, single episode, severe without psychotic features: Secondary | ICD-10-CM | POA: Diagnosis not present

## 2017-12-13 MED ORDER — DULOXETINE HCL 60 MG PO CPEP
60.0000 mg | ORAL_CAPSULE | Freq: Two times a day (BID) | ORAL | 2 refills | Status: DC
Start: 2017-12-13 — End: 2018-01-13

## 2017-12-13 MED ORDER — BREXPIPRAZOLE 2 MG PO TABS: 2.0000 mg | ORAL_TABLET | Freq: Every day | ORAL | 2 refills | Status: DC

## 2017-12-13 MED ORDER — TRAZODONE HCL 150 MG PO TABS: 150.0000 mg | ORAL_TABLET | Freq: Every day | ORAL | 2 refills | Status: DC

## 2017-12-13 NOTE — Progress Notes (Signed)
[place] MD/PA/NP OP Progress Note  12/13/2017 11:06 AM Victoria Mcdowell  MRN:  [id number]  Chief Complaint:  Chief Complaint    Depression; Anxiety; Follow-up     HPI: this patient is a 52 year old white female who is married to her female partner and lives with her 3 sons  in [place]. 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  The patient was referred by her primary physician, Dr. Teryl Lucy for further assessment of depression.  The patient states that she had a bad bout of depression in the late 90s. She saw a psychiatrist and was actually hospitalized in 2000 after she became suicidal. She was somewhat better on a combination of Paxil and Wellbutrin. She stayed on medications for a couple of years but then went back to college and lost her insurance and went off of them. She met her current wife and they decided to have children and the wife had artificial insemination to have the 3 children.  In the interim the patient completed her college degree in animal biology. 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 She since then her back is never really been right in she is in chronic pain and not able to work. She also has to be on oxygen because of sleep difficulties and she is scheduled to have a sleep study. She states that her if makes fun of all these problems that she has and seems to resent her. The wife now works full time in the patient stays at home and apparently is not happy with the situation. They have not been sexually intimate for over a year and did not sleep in the same bed. She feels rejected and unloved. She also feels like a failure due to her medical problems and inability to work.  Over the last couple of years the patient's depressive symptoms have resurfaced. She cries all the time, has no energy he doesn't enjoy anything in her life and is snappy and  irritable with the children. She has had thoughts of "I would rather be dead." She claims however that she would never hurt her self. She denies psychotic symptoms and does not use substances  The patient returns after 3 months.  She states she has been feeling worse again recently.  She has no energy and no motivation.  She was declined disability for the second time and feels worthless because of this.  Her wife now wants her to try to get a job and she does not see how she can do this because of severe back pain and also the fact that she is the primary parent taking care of the 3 children.  This is very frustrating for her.  She has been lying awake at night thinking a lot.  She is even had some suicidal thoughts but claims she will not act on them.  I suggested we add rexulti to her Cymbalta for augmentation and she is agreeable.  She has not seen her therapist recently and I suggested she reinstate this as well  Visit Diagnosis:    ICD-10-CM   1. Major depressive disorder, single episode, severe without psychotic features ([place]) F32.2     Past Psychiatric History: Hospitalization in 2000 for depression  Past Medical History:  Past Medical History:  Diagnosis Date  . Depression   . Diabetes mellitus, type II ([place])   .  Elevated cholesterol     Past Surgical History:  Procedure Laterality Date  . BREAST BIOPSY Left 2016  . CARPAL TUNNEL RELEASE    . KNEE SURGERY    . SPINE SURGERY    . ulner nerve release      Family Psychiatric History: See below  Family History:  Family History  Problem Relation Age of Onset  . Depression Mother   . Hypertension Mother   . Depression Father   . Cancer Father   . Diabetes Father   . Breast cancer Neg Hx     Social History:  Social History   Socioeconomic History  . Marital status: Married    Spouse name: Not on file  . Number of children: Not on file  . Years of education: Not on file  . Highest education level: Not on file   Occupational History  . Not on file  Social Needs  . Financial resource strain: Not on file  . Food insecurity:    Worry: Not on file    Inability: Not on file  . Transportation needs:    Medical: Not on file    Non-medical: Not on file  Tobacco Use  . Smoking status: Never Smoker  . Smokeless tobacco: Never Used  Substance and Sexual Activity  . Alcohol use: No  . Drug use: No  . Sexual activity: Not Currently  Lifestyle  . Physical activity:    Days per week: Not on file    Minutes per session: Not on file  . Stress: Not on file  Relationships  . Social connections:    Talks on phone: Not on file    Gets together: Not on file    Attends religious service: Not on file    Active member of club or organization: Not on file    Attends meetings of clubs or organizations: Not on file    Relationship status: Not on file  Other Topics Concern  . Not on file  Social History Narrative  . Not on file    Allergies:  Allergies  Allergen Reactions  . Biaxin [Clarithromycin] Swelling and Other (See Comments)    Reaction:  Facial swelling   . Trulicity [Dulaglutide]     Metabolic Disorder Labs: Lab Results  Component Value Date   HGBA1C 11.5 (H) 10/07/2017   MPG 283 10/07/2017   MPG 258 05/20/2017   No results found for: PROLACTIN Lab Results  Component Value Date   CHOL 166 05/20/2017   TRIG 132 05/20/2017   HDL 52 05/20/2017   CHOLHDL 3.2 05/20/2017   LDLCALC 91 05/20/2017   Lab Results  Component Value Date   TSH 1.22 05/20/2017   TSH 1.30 02/10/2017    Therapeutic Level Labs: No results found for: LITHIUM No results found for: VALPROATE No components found for:  CBMZ  Current Medications: Current Outpatient Medications  Medication Sig Dispense Refill  . atorvastatin (LIPITOR) 20 MG tablet Take 1 tablet (20 mg total) by mouth daily. 30 tablet 3  . baclofen (LIORESAL) 10 MG tablet Take 10 mg by mouth 3 (three) times daily as needed for muscle spasms.      . blood glucose meter kit and supplies KIT Dispense based on patient and insurance preference. Use up to four times daily as directed. (FOR ICD-10 E11.65) . 1 each 2  . Blood Glucose Monitoring Suppl (ONETOUCH VERIO) w/Device KIT 1 each by Does not apply route as needed. 1 kit 0  . Cholecalciferol (VITAMIN D3)  5000 units CAPS TAKE 1 CAPSULE BY MOUTH EVERY DAY 30 capsule 0  . DULoxetine (CYMBALTA) 60 MG capsule Take 1 capsule (60 mg total) by mouth 2 (two) times daily. 180 capsule 2  . gabapentin (NEURONTIN) 600 MG tablet Take 600 mg by mouth 3 (three) times daily.    Marland Kitchen glucose blood (CONTOUR NEXT TEST) test strip Use as instructed qid. E11.65 150 each 5  . insulin aspart (NOVOLOG) 100 UNIT/ML FlexPen Inject 15-21 Units into the skin 3 (three) times daily with meals. 10 pen 2  . Insulin Glargine 300 UNIT/ML SOPN Inject 60 Units into the skin at bedtime. 9 mL 2  . Insulin Pen Needle (B-D ULTRAFINE III SHORT PEN) 31G X 8 MM MISC 1 each by Does not apply route as directed. 100 each 3  . Lancets MISC 1 each by Does not apply route as directed. 150 each 3  . LANTUS SOLOSTAR 100 UNIT/ML Solostar Pen INJECT 60 UNITS SUBCUTANEOUSLY AT BEDTIME 15 mL 2  . metFORMIN (GLUCOPHAGE) 1000 MG tablet Take 1 tablet (1,000 mg total) by mouth 2 (two) times daily with a meal. 60 tablet 2  . oxyCODONE-acetaminophen (PERCOCET) 10-325 MG per tablet Take 1 tablet by mouth every 6 (six) hours as needed for pain.    . traZODone (DESYREL) 150 MG tablet Take 1 tablet (150 mg total) by mouth at bedtime. 90 tablet 2  . Brexpiprazole (REXULTI) 2 MG TABS Take 2 mg by mouth daily. 30 tablet 2   No current facility-administered medications for this visit.      Musculoskeletal: Strength & Muscle Tone: within normal limits Gait & Station: normal Patient leans: N/A  Psychiatric Specialty Exam: Review of Systems  Constitutional: Positive for malaise/fatigue.  Gastrointestinal: Positive for diarrhea and nausea.   Musculoskeletal: Positive for back pain.  Psychiatric/Behavioral: Positive for depression. The patient has insomnia.   All other systems reviewed and are negative.   Blood pressure 105/69, pulse 100, height _0  (1.702 m), weight 196 lb (88.9 kg), last menstrual period 12/25/2014, SpO2 95 %.Body mass index is 30.7 kg/m.  General Appearance: Casual and Fairly Groomed  Eye Contact:  Good  Speech:  Clear and Coherent  Volume:  Decreased  Mood:  Dysphoric and Worthless  Affect:  Constricted and Depressed  Thought Process:  Goal Directed  Orientation:  Full (Time, Place, and Person)  Thought Content: Rumination   Suicidal Thoughts:  Yes.  without intent/plan  Homicidal Thoughts:  No  Memory:  Immediate;   Good Recent;   Good Remote;   Good  Judgement:  Intact  Insight:  Fair  Psychomotor Activity:  Decreased  Concentration:  Concentration: Fair and Attention Span: Fair  Recall:  Good  Fund of Knowledge: Good  Language: Good  Akathisia:  No  Handed:  Right  AIMS (if indicated): not done  Assets:  Communication Skills Desire for Improvement Resilience Social Support Talents/Skills  ADL's:  Intact  Cognition: WNL  Sleep:  Poor   Screenings: PHQ2-9     Nutrition from 11/08/2017 in [facility] Visit from 10/12/2017 in [facility] Office Visit from 06/02/2017 in [facility] Office Visit from 04/12/2017 in [facility]  PHQ-2 Total Score  6  0  0  0  PHQ-9 Total Score  22  -  -  -       Assessment and Plan: This patient is a 52 year old female with a history of depression and some anxiety.  Since being  declined her disability she seems to feel more worthless and stressed.  Rather than start over with a new antidepressant I suggest that we add rexulti for augmentation.  She will start with 0.5 mg and gradually work up to 2 mg daily.  She will continue Cymbalta 60 mg  twice a day for depression and trazodone 150 mg at bedtime for sleep.  She will return to see me in 4 weeks or call sooner if needed   Levonne Spiller, MD 12/13/2017, 11:06 AM

## 2017-12-16 ENCOUNTER — Other Ambulatory Visit: Payer: Self-pay | Admitting: "Endocrinology

## 2018-01-02 ENCOUNTER — Telehealth (HOSPITAL_COMMUNITY): Payer: Self-pay

## 2018-01-02 NOTE — Telephone Encounter (Signed)
Medication management - Telephone call with [facility] to inform patient's Rexulti 2 mg, #30 was approved by [employer] from 12/30/17-12/31/18 and spoke with Nena Jordan, pharmacist to provide update so medication could be filled as ordered.

## 2018-01-04 LAB — COMPLETE METABOLIC PANEL WITH GFR
AG RATIO: 1.4 (calc) (ref 1.0–2.5)
ALBUMIN MSPROF: 3.9 g/dL (ref 3.6–5.1)
ALT: 12 U/L (ref 6–29)
AST: 12 U/L (ref 10–35)
Alkaline phosphatase (APISO): 70 U/L (ref 33–130)
BUN: 13 mg/dL (ref 7–25)
CO2: 29 mmol/L (ref 20–32)
CREATININE: 0.67 mg/dL (ref 0.50–1.05)
Calcium: 9 mg/dL (ref 8.6–10.4)
Chloride: 104 mmol/L (ref 98–110)
GFR, EST AFRICAN AMERICAN: 116 mL/min/{1.73_m2} (ref >=60)
GFR, EST NON AFRICAN AMERICAN: 100 mL/min/{1.73_m2} (ref >=60)
GLOBULIN: 2.7 g/dL (ref 1.9–3.7)
Glucose, Bld: 205 mg/dL — ABNORMAL HIGH (ref 65–99)
POTASSIUM: 4.8 mmol/L (ref 3.5–5.3)
Sodium: 141 mmol/L (ref 135–146)
Total Bilirubin: 0.2 mg/dL (ref 0.2–1.2)
Total Protein: 6.6 g/dL (ref 6.1–8.1)

## 2018-01-04 LAB — HEMOGLOBIN A1C
EAG (MMOL/L): 13.2 (calc)
Hgb A1c MFr Bld: 9.9 % of total Hgb — ABNORMAL HIGH (ref <5.7)
Mean Plasma Glucose: 237 (calc)

## 2018-01-05 ENCOUNTER — Ambulatory Visit: Payer: 59 | Admitting: "Endocrinology

## 2018-01-11 ENCOUNTER — Telehealth (HOSPITAL_COMMUNITY): Payer: Self-pay | Admitting: *Deleted

## 2018-01-11 NOTE — Telephone Encounter (Signed)
[employer] faxed 2 P.A. For the Following:  Rexulti Tablets   EFFECTIVE : 12/30/2017 --- 12/31/2018  Duloxetine EFFECTIVE: 12/26/2017 --- 12/27/2018

## 2018-01-12 ENCOUNTER — Ambulatory Visit (HOSPITAL_COMMUNITY): Payer: Self-pay | Admitting: Psychiatry

## 2018-01-13 ENCOUNTER — Encounter (HOSPITAL_COMMUNITY): Payer: Self-pay | Admitting: Psychiatry

## 2018-01-13 ENCOUNTER — Telehealth (HOSPITAL_COMMUNITY): Payer: Self-pay | Admitting: *Deleted

## 2018-01-13 ENCOUNTER — Ambulatory Visit (INDEPENDENT_AMBULATORY_CARE_PROVIDER_SITE_OTHER): Payer: PRIVATE HEALTH INSURANCE | Admitting: Psychiatry

## 2018-01-13 VITALS — BP 121/80 | HR 107 | Ht 67.0 in | Wt 195.0 lb

## 2018-01-13 DIAGNOSIS — M549 Dorsalgia, unspecified: Secondary | ICD-10-CM | POA: Diagnosis not present

## 2018-01-13 DIAGNOSIS — Z818 Family history of other mental and behavioral disorders: Secondary | ICD-10-CM

## 2018-01-13 DIAGNOSIS — F322 Major depressive disorder, single episode, severe without psychotic features: Secondary | ICD-10-CM | POA: Diagnosis not present

## 2018-01-13 DIAGNOSIS — G4700 Insomnia, unspecified: Secondary | ICD-10-CM

## 2018-01-13 DIAGNOSIS — Z560 Unemployment, unspecified: Secondary | ICD-10-CM

## 2018-01-13 MED ORDER — BREXPIPRAZOLE 2 MG PO TABS: 2.0000 mg | ORAL_TABLET | Freq: Every day | ORAL | 2 refills | Status: DC

## 2018-01-13 MED ORDER — DULOXETINE HCL 60 MG PO CPEP: 60.0000 mg | ORAL_CAPSULE | Freq: Two times a day (BID) | ORAL | 2 refills | Status: DC

## 2018-01-13 MED ORDER — TRAZODONE HCL 150 MG PO TABS: 150.0000 mg | ORAL_TABLET | Freq: Every day | ORAL | 2 refills | Status: DC

## 2018-01-13 NOTE — Telephone Encounter (Signed)
Dr Tenny Craw Patient has called back to the front office "stating that she has just got off the phone with her  Lawyer & that her chart notes need to say that she can't work due to depression. So that they can   refile her disability

## 2018-01-13 NOTE — Progress Notes (Signed)
[place] MD/PA/NP OP Progress Note  01/13/2018 10:52 AM Victoria Mcdowell  MRN:  [id number]  Chief Complaint:  Chief Complaint    Depression; Anxiety; Follow-up     HPI: this patient is a 52 year old white female who is married to her female partner and lives with her 3 sons in [place]. 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  The patient was referred by her primary physician, Dr. Teryl Lucy for further assessment of depression.  The patient states that she had a bad bout of depression in the late 90s. She saw a psychiatrist and was actually hospitalized in 2000 after she became suicidal. She was somewhat better on a combination of Paxil and Wellbutrin. She stayed on medications for a couple of years but then went back to college and lost her insurance and went off of them. She met her current wife and they decided to have children and the wife had artificial insemination to have the 3 children.  In the interim the patient completed her college degree in animal biology. 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 She since then her back is never really been right in she is in chronic pain and not able to work. She also has to be on oxygen because of sleep difficulties and she is scheduled to have a sleep study. She states that her if makes fun of all these problems that she has and seems to resent her. The wife now works full time in the patient stays at home and apparently is not happy with the situation. They have not been sexually intimate for over a year and did not sleep in the same bed. She feels rejected and unloved. She also feels like a failure due to her medical problems and inability to work.  Over the last couple of years the patient's depressive symptoms have resurfaced. She cries all the time, has no energy he doesn't enjoy anything in her life and is snappy and  irritable with the children. She has had thoughts of "I would rather be dead." She claims however that she would never hurt her self. She denies psychotic symptoms and does not use substances  The patient returns after 4 weeks.  Last time we added Rexulti to her regimen because she was still quite depressed.  She really has not been on it consistently for more than about a week because of insurance issues.  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  She is been in tremendous pain recently she states that her partners expectations of her getting a job are not realistic.  She has been turned down for disability and is going to reapply.  A lot of the time she feels worthless but claims she will not harm herself.  She has not yet gotten in touch with a counselor but she promises she will do it this month.  I told her that since we just started the [place] we need to give it more time and she agrees Visit Diagnosis:    ICD-10-CM   1. Major depressive disorder, single episode, severe without psychotic features ([place]) F32.2     Past Psychiatric History: Hospitalization in 2000 for depression  Past Medical History:  Past Medical History:  Diagnosis Date  .  Depression   . Diabetes mellitus, type II ([place])   . Elevated cholesterol     Past Surgical History:  Procedure Laterality Date  . BREAST BIOPSY Left 2016  . CARPAL TUNNEL RELEASE    . KNEE SURGERY    . SPINE SURGERY    . ulner nerve release      Family Psychiatric History: See below  Family History:  Family History  Problem Relation Age of Onset  . Depression Mother   . Hypertension Mother   . Depression Father   . Cancer Father   . Diabetes Father   . Breast cancer Neg Hx     Social History:  Social History   Socioeconomic History  . Marital status: Married    Spouse name: Not on file  . Number of children: Not on file  .  Years of education: Not on file  . Highest education level: Not on file  Occupational History  . Not on file  Social Needs  . Financial resource strain: Not on file  . Food insecurity:    Worry: Not on file    Inability: Not on file  . Transportation needs:    Medical: Not on file    Non-medical: Not on file  Tobacco Use  . Smoking status: Never Smoker  . Smokeless tobacco: Never Used  Substance and Sexual Activity  . Alcohol use: No  . Drug use: No  . Sexual activity: Not Currently  Lifestyle  . Physical activity:    Days per week: Not on file    Minutes per session: Not on file  . Stress: Not on file  Relationships  . Social connections:    Talks on phone: Not on file    Gets together: Not on file    Attends religious service: Not on file    Active member of club or organization: Not on file    Attends meetings of clubs or organizations: Not on file    Relationship status: Not on file  Other Topics Concern  . Not on file  Social History Narrative  . Not on file    Allergies:  Allergies  Allergen Reactions  . Biaxin [Clarithromycin] Swelling and Other (See Comments)    Reaction:  Facial swelling   . Trulicity [Dulaglutide]     Metabolic Disorder Labs: Lab Results  Component Value Date   HGBA1C 9.9 (H) 01/03/2018   MPG 237 01/03/2018   MPG 283 10/07/2017   No results found for: PROLACTIN Lab Results  Component Value Date   CHOL 166 05/20/2017   TRIG 132 05/20/2017   HDL 52 05/20/2017   CHOLHDL 3.2 05/20/2017   LDLCALC 91 05/20/2017   Lab Results  Component Value Date   TSH 1.22 05/20/2017   TSH 1.30 02/10/2017    Therapeutic Level Labs: No results found for: LITHIUM No results found for: VALPROATE No components found for:  CBMZ  Current Medications: Current Outpatient Medications  Medication Sig Dispense Refill  . atorvastatin (LIPITOR) 20 MG tablet Take 1 tablet (20 mg total) by mouth daily. 30 tablet 3  . baclofen (LIORESAL) 10 MG tablet  Take 10 mg by mouth 3 (three) times daily as needed for muscle spasms.     . blood glucose meter kit and supplies KIT Dispense based on patient and insurance preference. Use up to four times daily as directed. (FOR ICD-10 E11.65) . 1 each 2  . Blood Glucose Monitoring Suppl (ONETOUCH VERIO) w/Device KIT 1 each by Does not  apply route as needed. 1 kit 0  . Brexpiprazole (REXULTI) 2 MG TABS Take 2 mg by mouth daily. 30 tablet 2  . Cholecalciferol (VITAMIN D3) 5000 units CAPS TAKE 1 CAPSULE BY MOUTH EVERY DAY 30 capsule 0  . DULoxetine (CYMBALTA) 60 MG capsule Take 1 capsule (60 mg total) by mouth 2 (two) times daily. 180 capsule 2  . gabapentin (NEURONTIN) 600 MG tablet Take 600 mg by mouth 3 (three) times daily.    Marland Kitchen glucose blood (CONTOUR NEXT TEST) test strip Use as instructed qid. E11.65 150 each 5  . insulin aspart (NOVOLOG) 100 UNIT/ML FlexPen Inject 15-21 Units into the skin 3 (three) times daily with meals. 10 pen 2  . Insulin Glargine 300 UNIT/ML SOPN Inject 60 Units into the skin at bedtime. 9 mL 2  . Insulin Pen Needle (B-D ULTRAFINE III SHORT PEN) 31G X 8 MM MISC 1 each by Does not apply route as directed. 100 each 3  . Lancets MISC 1 each by Does not apply route as directed. 150 each 3  . LANTUS SOLOSTAR 100 UNIT/ML Solostar Pen INJECT 60 UNITS SUBCUTANEOUSLY AT BEDTIME 15 mL 2  . metFORMIN (GLUCOPHAGE) 1000 MG tablet Take 1 tablet (1,000 mg total) by mouth 2 (two) times daily with a meal. 60 tablet 2  . ONETOUCH VERIO test strip USE TO TEST 3 TIMES DAILY 250 each 2  . oxyCODONE-acetaminophen (PERCOCET) 10-325 MG per tablet Take 1 tablet by mouth every 6 (six) hours as needed for pain.    . traZODone (DESYREL) 150 MG tablet Take 1 tablet (150 mg total) by mouth at bedtime. 90 tablet 2   No current facility-administered medications for this visit.      Musculoskeletal: Strength & Muscle Tone: decreased Gait & Station: normal Patient leans: N/A  Psychiatric Specialty Exam: Review  of Systems  Musculoskeletal: Positive for back pain.  Psychiatric/Behavioral: Positive for depression.    Blood pressure 121/80, pulse (!) 107, height '5\' 7"'  (1.702 m), weight 195 lb (88.5 kg), last menstrual period 12/25/2014, SpO2 98 %.Body mass index is 30.54 kg/m.  General Appearance: Casual and Fairly Groomed  Eye Contact:  Good  Speech:  Clear and Coherent  Volume:  Decreased  Mood:  Dysphoric and Hopeless  Affect:  Constricted, Depressed and Tearful  Thought Process:  Goal Directed  Orientation:  Full (Time, Place, and Person)  Thought Content: Rumination   Suicidal Thoughts:  No  Homicidal Thoughts:  No  Memory:  Immediate;   Good Recent;   Good Remote;   Good  Judgement:  Good  Insight:  Good  Psychomotor Activity:  Decreased  Concentration:  Concentration: Fair and Attention Span: Fair  Recall:  Good  Fund of Knowledge: Good  Language: Good  Akathisia:  No  Handed:  Right  AIMS (if indicated): not done  Assets:  Communication Skills Desire for Improvement Resilience Social Support Talents/Skills  ADL's:  Intact  Cognition: WNL  Sleep:  Good   Screenings: PHQ2-9     Nutrition from 11/08/2017 in [facility] Office Visit from 10/12/2017 in [facility] Office Visit from 06/02/2017 in [facility] Office Visit from 04/12/2017 in [facility]  PHQ-2 Total Score  6  0  0  0  PHQ-9 Total Score  22  -  -  -       Assessment and Plan: This patient is a 52 year old female with a history of depression.  She was doing better for  a while but with recent demands on her from her wife regarding work increased chronic pain and stress her depression has worsened.  We just started Rexulti for augmentation and will give it a little bit more time to see if it will help.  She will continue Cymbalta 60 mg twice a day for depression and trazodone 150 mill grams at bedtime for  sleep.  She is going to talk to pain management about getting her pain under better control and will also contact her counselor for sessions.  She will return to see me in 4 weeks or call sooner if needed   Levonne Spiller, MD 01/13/2018, 10:52 AM

## 2018-02-02 ENCOUNTER — Other Ambulatory Visit: Payer: Self-pay | Admitting: Obstetrics and Gynecology

## 2018-02-02 DIAGNOSIS — Z1231 Encounter for screening mammogram for malignant neoplasm of breast: Secondary | ICD-10-CM

## 2018-02-08 ENCOUNTER — Other Ambulatory Visit: Payer: Self-pay | Admitting: "Endocrinology

## 2018-02-09 ENCOUNTER — Encounter: Payer: Self-pay | Admitting: "Endocrinology

## 2018-02-09 ENCOUNTER — Ambulatory Visit (INDEPENDENT_AMBULATORY_CARE_PROVIDER_SITE_OTHER): Payer: 59 | Admitting: "Endocrinology

## 2018-02-09 ENCOUNTER — Other Ambulatory Visit: Payer: Self-pay

## 2018-02-09 ENCOUNTER — Encounter: Payer: 59 | Attending: Obstetrics and Gynecology | Admitting: Nutrition

## 2018-02-09 ENCOUNTER — Encounter: Payer: Self-pay | Admitting: Nutrition

## 2018-02-09 VITALS — BP 118/70 | HR 84 | Ht 66.0 in | Wt 199.0 lb

## 2018-02-09 VITALS — Ht 67.0 in | Wt 199.0 lb

## 2018-02-09 DIAGNOSIS — E782 Mixed hyperlipidemia: Secondary | ICD-10-CM

## 2018-02-09 DIAGNOSIS — Z713 Dietary counseling and surveillance: Secondary | ICD-10-CM | POA: Diagnosis not present

## 2018-02-09 DIAGNOSIS — Z794 Long term (current) use of insulin: Secondary | ICD-10-CM

## 2018-02-09 DIAGNOSIS — E118 Type 2 diabetes mellitus with unspecified complications: Secondary | ICD-10-CM

## 2018-02-09 DIAGNOSIS — E1165 Type 2 diabetes mellitus with hyperglycemia: Secondary | ICD-10-CM | POA: Diagnosis not present

## 2018-02-09 DIAGNOSIS — IMO0002 Reserved for concepts with insufficient information to code with codable children: Secondary | ICD-10-CM

## 2018-02-09 DIAGNOSIS — E669 Obesity, unspecified: Secondary | ICD-10-CM

## 2018-02-09 MED ORDER — FREESTYLE LIBRE 14 DAY SENSOR MISC: 1.0000 | 2 refills | Status: DC

## 2018-02-09 MED ORDER — FREESTYLE LIBRE 14 DAY READER DEVI: 1.0000 | Freq: Once | 0 refills | Status: AC

## 2018-02-09 MED ORDER — INSULIN GLARGINE 300 UNIT/ML ~~LOC~~ SOPN: 80.0000 [IU] | PEN_INJECTOR | Freq: Every day | SUBCUTANEOUS | 2 refills | Status: AC

## 2018-02-09 NOTE — Progress Notes (Signed)
  Medical Nutrition Therapy:  Appt start time: 1000 end time:  1030  Assessment:  Primary concerns today: Diabetes Type 2.. Lives with her wife.    Has a boot on her left due to rupture of her plantar fascitits.  Will be in shoe boot for 2-3 more weeks. Hasn't been as active. She notes she has struggled with her depression this past month. Her MD added Rexulti  2 mg once a day. She doesn't think it's helping any. Has no appetite and no energy. Wants to sleep mostly. Has been skipping meals and not hungry and so hasn't been taking her meal time insulin when she skips meals.  She wants to look into the [place] for better monitoring of BS. To see Dr. Fransico HimNIda today. A1C down to 9.9% from 11.9%. Meter and BS logs brought in.  Avg BS 250's.   Lab Results  Component Value Date   HGBA1C 9.9 (H) 01/03/2018   Preferred Learning Style:   No preference indicated   Learning Readiness:    Ready  Change in progress   MEDICATIONS: See list   DIETARY INTAKE:  24-hr recall:  B ( AM): Bowl of cereal corn flakes or rice krispies  Snk ( AM):  L ( PM): SKipped lunch. Snk ( PM):  D ( PM): Pork chop, okra, and toast 1 slice, Unsweet tea or water Snk ( PM):  Beverages: water, milk,  Usual physical activity: Walks some-plans to join PF.  Estimated energy needs: 1500  calories 170  g carbohydrates 112 g protein 42 g fat  Progress Towards Goal(s):  In progress.   Nutritional Diagnosis:  NB-1.1 Food and nutrition-related knowledge deficit As related to Diabetes.  As evidenced by A1C 10.6%.    Intervention:  Nutrition and Diabetes education provided on My Plate, CHO counting, meal planning, portion sizes, timing of meals, avoiding snacks between meals unless having a low blood sugar, target ranges for A1C and blood sugars, signs/symptoms and treatment of hyper/hypoglycemia, monitoring blood sugars, taking medications as prescribed, benefits of exercising 30 minutes per day and prevention of  complications of DM. Marland Kitchen. Goal 1. Try not to skip meals. 2. Increase fresh fruits and more lower carb vegetables. 3. When allowed, get back to walking.  Lose 1-2 lbs per week Take insulin as prescribed.  Teaching Method Utilized:  Visual Auditory Hands on  Handouts given during visit include:  The Plate Method   Meal Plan Card  Barriers to learning/adherence to lifestyle change:  none  Demonstrated degree of understanding via:  Teach Back   Monitoring/Evaluation:  Dietary intake, exercise,  Meal planing, , and body weight in 1 month(s).

## 2018-02-09 NOTE — Patient Instructions (Signed)

## 2018-02-09 NOTE — Patient Instructions (Addendum)
Goal 1. Try not to skip meals. 2. Increase fresh fruits and more lower carb vegetables. 3. When allowed, get back to walking.  Lose 1-2 lbs per week Take insulin as prescribed.

## 2018-02-09 NOTE — Progress Notes (Signed)
Subjective:    Patient ID: Victoria Mcdowell, female    DOB: 1966/02/22. Patient is being seen in follow-up for management of diabetes requested by  Beola Cord, FNP  Past Medical History:  Diagnosis Date  . Depression   . Diabetes mellitus, type II ([place])   . Elevated cholesterol    Past Surgical History:  Procedure Laterality Date  . BREAST BIOPSY Left 2016  . CARPAL TUNNEL RELEASE    . KNEE SURGERY    . SPINE SURGERY    . ulner nerve release     Social History   Socioeconomic History  . Marital status: Married    Spouse name: Not on file  . Number of children: Not on file  . Years of education: Not on file  . Highest education level: Not on file  Occupational History  . Not on file  Social Needs  . Financial resource strain: Not on file  . Food insecurity:    Worry: Not on file    Inability: Not on file  . Transportation needs:    Medical: Not on file    Non-medical: Not on file  Tobacco Use  . Smoking status: Never Smoker  . Smokeless tobacco: Never Used  Substance and Sexual Activity  . Alcohol use: No  . Drug use: No  . Sexual activity: Not Currently  Lifestyle  . Physical activity:    Days per week: Not on file    Minutes per session: Not on file  . Stress: Not on file  Relationships  . Social connections:    Talks on phone: Not on file    Gets together: Not on file    Attends religious service: Not on file    Active member of club or organization: Not on file    Attends meetings of clubs or organizations: Not on file    Relationship status: Not on file  Other Topics Concern  . Not on file  Social History Narrative  . Not on file   Outpatient Encounter Medications as of 02/09/2018  Medication Sig  . atorvastatin (LIPITOR) 20 MG tablet Take 1 tablet (20 mg total) by mouth daily.  . baclofen (LIORESAL) 10 MG tablet Take 10 mg by mouth 3 (three) times daily as needed for muscle spasms.   . blood glucose meter kit and supplies KIT Dispense  based on patient and insurance preference. Use up to four times daily as directed. (FOR ICD-10 E11.65) .  Marland Kitchen Blood Glucose Monitoring Suppl (ONETOUCH VERIO) w/Device KIT 1 each by Does not apply route as needed.  . Brexpiprazole (REXULTI) 2 MG TABS Take 2 mg by mouth daily.  . Cholecalciferol (VITAMIN D3) 5000 units CAPS TAKE 1 CAPSULE BY MOUTH EVERY DAY  . Continuous Blood Gluc Receiver (FREESTYLE LIBRE 14 DAY READER) DEVI 1 each by Does not apply route once for 1 dose.  . Continuous Blood Gluc Sensor (FREESTYLE LIBRE 14 DAY SENSOR) MISC Inject 1 each into the skin every 14 (fourteen) days. Use as directed.  . DULoxetine (CYMBALTA) 60 MG capsule Take 1 capsule (60 mg total) by mouth 2 (two) times daily.  Marland Kitchen gabapentin (NEURONTIN) 600 MG tablet Take 600 mg by mouth 3 (three) times daily.  Marland Kitchen glucose blood (CONTOUR NEXT TEST) test strip Use as instructed qid. E11.65  . insulin aspart (NOVOLOG) 100 UNIT/ML FlexPen Inject 15-21 Units into the skin 3 (three) times daily with meals.  . Insulin Glargine 300 UNIT/ML SOPN Inject 80 Units into the  skin at bedtime.  . Insulin Pen Needle (B-D ULTRAFINE III SHORT PEN) 31G X 8 MM MISC 1 each by Does not apply route as directed.  . Lancets MISC 1 each by Does not apply route as directed.  . metFORMIN (GLUCOPHAGE) 1000 MG tablet Take 1 tablet (1,000 mg total) by mouth 2 (two) times daily with a meal.  . ONETOUCH VERIO test strip USE TO TEST 3 TIMES DAILY  . oxyCODONE-acetaminophen (PERCOCET) 10-325 MG per tablet Take 1 tablet by mouth every 6 (six) hours as needed for pain.  . traZODone (DESYREL) 150 MG tablet Take 1 tablet (150 mg total) by mouth at bedtime.  . [DISCONTINUED] Insulin Glargine 300 UNIT/ML SOPN Inject 60 Units into the skin at bedtime.  . [DISCONTINUED] LANTUS SOLOSTAR 100 UNIT/ML Solostar Pen INJECT 60 UNITS SUBCUTANEOUSLY AT BEDTIME   No facility-administered encounter medications on file as of 02/09/2018.    ALLERGIES: Allergies  Allergen  Reactions  . Biaxin [Clarithromycin] Swelling and Other (See Comments)    Reaction:  Facial swelling   . Trulicity [Dulaglutide]    VACCINATION STATUS:  There is no immunization history on file for this patient.  Diabetes  She presents for her follow-up diabetic visit. She has type 2 diabetes mellitus. Her disease course has been improving. There are no hypoglycemic associated symptoms. Pertinent negatives for hypoglycemia include no confusion, headaches, pallor or seizures. Associated symptoms include polydipsia and polyuria. Pertinent negatives for diabetes include no blurred vision, no chest pain, no fatigue, no foot paresthesias and no polyphagia. There are no hypoglycemic complications. Symptoms are improving. Diabetic complications include peripheral neuropathy. Risk factors for coronary artery disease include family history, dyslipidemia, diabetes mellitus, hypertension and sedentary lifestyle. Current diabetic treatment includes insulin injections (She says she was put on Humulin 70/30-55 units twice a day, however she admits she has not been consistent taking this much insulin.). She is compliant with treatment most of the time. Her weight is increasing steadily. She is following a generally unhealthy diet. When asked about meal planning, she reported none. She has not had a previous visit with a dietitian. She participates in exercise intermittently. Her home blood glucose trend is decreasing steadily. Her breakfast blood glucose range is generally 180-200 mg/dl. Her lunch blood glucose range is generally 180-200 mg/dl. Her dinner blood glucose range is generally 180-200 mg/dl. Her bedtime blood glucose range is generally 180-200 mg/dl. Her overall blood glucose range is 180-200 mg/dl. An ACE inhibitor/angiotensin II receptor blocker is not being taken. Eye exam is current (She denies any retinopathy.).  Hyperlipidemia  This is a chronic problem. The current episode started more than 1 year  ago. The problem is uncontrolled. Recent lipid tests were reviewed and are high. Exacerbating diseases include diabetes and obesity. Pertinent negatives include no chest pain, myalgias or shortness of breath. Current antihyperlipidemic treatment includes statins. Risk factors for coronary artery disease include dyslipidemia, diabetes mellitus, family history, hypertension, a sedentary lifestyle and obesity.  Hypertension  This is a chronic problem. The current episode started more than 1 year ago. The problem is controlled. Pertinent negatives include no blurred vision, chest pain, headaches, palpitations or shortness of breath. Risk factors for coronary artery disease include diabetes mellitus, dyslipidemia, obesity, sedentary lifestyle and post-menopausal state. Past treatments include nothing.     Review of Systems  Constitutional: Negative for chills, fatigue, fever and unexpected weight change.  HENT: Negative for trouble swallowing and voice change.   Eyes: Negative for blurred vision and visual disturbance.  Respiratory: Negative for cough, shortness of breath and wheezing.   Cardiovascular: Negative for chest pain, palpitations and leg swelling.  Gastrointestinal: Negative for diarrhea, nausea and vomiting.  Endocrine: Positive for polydipsia and polyuria. Negative for cold intolerance, heat intolerance and polyphagia.  Musculoskeletal: Negative for arthralgias and myalgias.  Skin: Negative for color change, pallor, rash and wound.  Neurological: Negative for seizures and headaches.  Psychiatric/Behavioral: Negative for confusion and suicidal ideas.    Objective:    BP 118/70   Pulse 84   Ht _0  (1.676 m)   Wt 199 lb (90.3 kg)   LMP 12/25/2014   BMI 32.12 kg/m   Wt Readings from Last 3 Encounters:  02/09/18 199 lb (90.3 kg)  02/09/18 199 lb (90.3 kg)  11/08/17 198 lb (89.8 kg)    Physical Exam  Constitutional: She is oriented to person, place, and time. She appears  well-developed.  HENT:  Head: Normocephalic and atraumatic.  Dry mucous membranes.  Eyes: EOM are normal.  Neck: Normal range of motion. Neck supple. No tracheal deviation present. No thyromegaly present.  Cardiovascular: Normal rate and regular rhythm.  Pulmonary/Chest: Effort normal and breath sounds normal.  Abdominal: Soft. Bowel sounds are normal. There is no tenderness. There is no guarding.  Musculoskeletal: Normal range of motion. She exhibits no edema.  Neurological: She is alert and oriented to person, place, and time. She has normal reflexes. No cranial nerve deficit. Coordination normal.  Skin: Skin is warm and dry. No rash noted. No erythema. No pallor.  Psychiatric: She has a normal mood and affect.    CMP     Component Value Date/Time   NA 141 01/03/2018 1017   K 4.8 01/03/2018 1017   CL 104 01/03/2018 1017   CO2 29 01/03/2018 1017   GLUCOSE 205 (H) 01/03/2018 1017   BUN 13 01/03/2018 1017   BUN 15 02/10/2017   CREATININE 0.67 01/03/2018 1017   CALCIUM 9.0 01/03/2018 1017   PROT 6.6 01/03/2018 1017   AST 12 01/03/2018 1017   ALT 12 01/03/2018 1017   BILITOT 0.2 01/03/2018 1017   GFRNONAA 100 01/03/2018 1017   GFRAA 116 01/03/2018 1017   Diabetic Labs (most recent): Lab Results  Component Value Date   HGBA1C 9.9 (H) 01/03/2018   HGBA1C 11.5 (H) 10/07/2017   HGBA1C 10.6 (H) 05/20/2017     Lipid Panel ( most recent) Lipid Panel     Component Value Date/Time   CHOL 166 05/20/2017 1112   TRIG 132 05/20/2017 1112   HDL 52 05/20/2017 1112   CHOLHDL 3.2 05/20/2017 1112   LDLCALC 91 05/20/2017 1112     Assessment & Plan:   1. Uncontrolled type 2 diabetes mellitus with complication, with long-term current use of insulin ([place])  - Patient has currently uncontrolled symptomatic type 2 DM since  52 years of age. -She came with A1c of 9.9%, overall improving from >15.5%. -Presents with her complete logs for the most recent weeks due to her mood disorder  getting in the way.     Recent labs reviewed.   Her diabetes is complicated by  peripheral neuropathy, depression and patient remains at a high risk for more acute and chronic complications which include CAD, CVA, CKD, retinopathy, and neuropathy. These are all discussed in detail with the patient.  - I have counseled the patient on diet management and weight loss, by adopting a carbohydrate restricted/protein rich diet.  -  Suggestion is made for her to avoid simple  carbohydrates  from her diet including Cakes, Sweet Desserts / Pastries, Ice Cream, Soda (diet and regular), Sweet Tea, Candies, Chips, Cookies, Store Bought Juices, Alcohol in Excess of  1-2 drinks a day, Artificial Sweeteners, and "Sugar-free" Products. This will help patient to have stable blood glucose profile and potentially avoid unintended weight gain.  - I encouraged the patient to switch to  unprocessed or minimally processed complex starch and increased protein intake (animal or plant source), fruits, and vegetables.  - Patient is advised to stick to a routine mealtimes to eat 3 meals  a day and avoid unnecessary snacks ( to snack only to correct hypoglycemia).  - I have approached patient with the following individualized plan to manage diabetes and patient agrees:   -She has responded to intensive treatment with basal/bolus insulin, and she is counseled to stay engaged for same insulin program. -I would proceed to increase her Toujeo to 80 units nightly, continue NovoLog  15 units 3 times daily before meals for pre-meal blood glucose above 90 mg/dL with additional correction for readings above 150 mg/dL, associated with strict monitoring of blood glucose 4 times a day-before meals and at bedtime .  - Patient is warned not to take insulin without proper monitoring per orders.  -Patient is encouraged to call clinic for blood glucose levels less than 70 or above 300 mg /dl. -She will benefit from continuous glucose  monitoring.  I discussed and prescribed the freestyle libre device for her. - I will continue metformin  1000 mg by mouth twice a day , therapeutically suitable for patient .  - Patient specific target  A1c;  LDL, HDL, Triglycerides, and  Waist Circumference were discussed in detail.  2) BP/HTN: Her blood pressure is controlled to target.    She is currently not on any antihypertensive medications.   3) Lipids/HPL:   controlled with triglycerides improving to 132 from  404, total cholesterol   improving to 166 from 293, HDL 52, and LDL 91 .  She is advised to continue atorvastatin 20 mg p.o. nightly.   4)  Weight/Diet: CDE Consult has been initiated , exercise, and detailed carbohydrates information provided.  5) vitamin D deficiency:  -   She was recently initiated on  vitamin D 3 5000 units daily for the next 90 days, she is advised to finish.  6) Chronic Care/Health Maintenance:  -Patient is not on  ACEI/ARB and  started Statin medications and encouraged to continue to follow up with Ophthalmology, Podiatrist at least yearly or according to recommendations, and advised to   stay away from smoking. I have recommended yearly flu vaccine and pneumonia vaccination at least every 5 years; moderate intensity exercise for up to 150 minutes weekly; and  sleep for at least 7 hours a day.  - I advised patient to maintain close follow up with Beola Cord, FNP for primary care needs.  - Time spent with the patient: 25 min, of which >50% was spent in reviewing her blood glucose logs , discussing her hypo- and hyper-glycemic episodes, reviewing her current and  previous labs and insulin doses and developing a plan to avoid hypo- and hyper-glycemia. Please refer to Patient Instructions for Blood Glucose Monitoring and Insulin/Medications Dosing Guide"  in media tab for additional information. Victoria Mcdowell participated in the discussions, expressed understanding, and voiced agreement with the above  plans.  All questions were answered to her satisfaction. she is encouraged to contact clinic should she have any questions or concerns  prior to her return visit.  Follow up plan: - Return in about 3 months (around 05/12/2018) for meter, and logs.  Glade Lloyd, MD Phone: [phone]  Fax: [phone]   This note was partially dictated with voice recognition software. Similar sounding words can be transcribed inadequately or may not  be corrected upon review.  02/09/2018, 1:58 PM

## 2018-02-10 ENCOUNTER — Other Ambulatory Visit: Payer: Self-pay | Admitting: "Endocrinology

## 2018-02-10 DIAGNOSIS — E1165 Type 2 diabetes mellitus with hyperglycemia: Secondary | ICD-10-CM

## 2018-02-10 NOTE — Progress Notes (Signed)
refer

## 2018-02-16 ENCOUNTER — Encounter (HOSPITAL_COMMUNITY): Payer: Self-pay | Admitting: Psychiatry

## 2018-02-16 ENCOUNTER — Ambulatory Visit (INDEPENDENT_AMBULATORY_CARE_PROVIDER_SITE_OTHER): Payer: 59 | Admitting: Psychiatry

## 2018-02-16 VITALS — BP 108/71 | HR 106 | Ht 66.0 in | Wt 202.0 lb

## 2018-02-16 DIAGNOSIS — F322 Major depressive disorder, single episode, severe without psychotic features: Secondary | ICD-10-CM | POA: Diagnosis not present

## 2018-02-16 MED ORDER — DULOXETINE HCL 60 MG PO CPEP: 60.0000 mg | ORAL_CAPSULE | Freq: Two times a day (BID) | ORAL | 2 refills | Status: DC

## 2018-02-16 MED ORDER — BREXPIPRAZOLE 2 MG PO TABS: 2.0000 mg | ORAL_TABLET | Freq: Every day | ORAL | 2 refills | Status: DC

## 2018-02-16 MED ORDER — TRAZODONE HCL 150 MG PO TABS: 150.0000 mg | ORAL_TABLET | Freq: Every day | ORAL | 2 refills | Status: DC

## 2018-02-16 NOTE — Progress Notes (Signed)
[facility] MD/PA/NP OP Progress Note  02/16/2018 9:37 AM Victoria Mcdowell  MRN:  [id number]  Chief Complaint:  Chief Complaint    Depression; Anxiety; Follow-up     HPI: this patient is a 52 year old white female who is married to her female partner and lives with her 3 sons in [place]. 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  The patient was referred by her primary physician, Dr. Teryl Lucy for further assessment of depression.  The patient states that she had a bad bout of depression in the late 90s. She saw a psychiatrist and was actually hospitalized in 2000 after she became suicidal. She was somewhat better on a combination of Paxil and Wellbutrin. She stayed on medications for a couple of years but then went back to college and lost her insurance and went off of them. She met her current wife and they decided to have children and the wife had artificial insemination to have the 3 children.  In the interim the patient completed her college degree in animal biology. 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 She since then her back is never really been right in she is in chronic pain and not able to work. She also has to be on oxygen because of sleep difficulties and she is scheduled to have a sleep study. She states that her if makes fun of all these problems that she has and seems to resent her. The wife now works full time in the patient stays at home and apparently is not happy with the situation. They have not been sexually intimate for over a year and did not sleep in the same bed. She feels rejected and unloved. She also feels like a failure due to her medical problems and inability to work.  Over the last couple of years the patient's depressive symptoms have resurfaced. She cries all the time, has no energy he doesn't enjoy anything in her life and is snappy and  irritable with the children. She has had thoughts of "I would rather be dead." She claims however that she would never hurt her self. She denies psychotic symptoms and does not use substances  The patient returns after 1 month.  Last time she was still somewhat depressed but was not taking the Rexulti consistently yet because she had trouble getting the insurance to approve it.  She has not been taking it consistently for about 4 weeks and she is starting to feel somewhat better.  She is also met with the endocrinologist and nutritionist and is working more diligently on getting her blood sugar down.  She still has significant back pain which contributes greatly to her depression and her inability to work.  Her wife wants her to work but she really does not feel able to.  She also currently has plantar fasciitis and is wearing a boot on her left foot.  She denies suicidal ideation today and her affect seems somewhat brighter. Visit Diagnosis:    ICD-10-CM   1. Major depressive disorder, single episode, severe without psychotic features ([place]) F32.2     Past Psychiatric History: Hospitalization in 2000 for depression  Past Medical History:  Past Medical History:  Diagnosis Date  . Depression   . Diabetes mellitus, type II ([place])   . Elevated cholesterol     Past Surgical History:  Procedure Laterality Date  .  BREAST BIOPSY Left 2016  . CARPAL TUNNEL RELEASE    . KNEE SURGERY    . SPINE SURGERY    . ulner nerve release      Family Psychiatric History: See below  Family History:  Family History  Problem Relation Age of Onset  . Depression Mother   . Hypertension Mother   . Depression Father   . Cancer Father   . Diabetes Father   . Breast cancer Neg Hx     Social History:  Social History   Socioeconomic History  . Marital status: Married    Spouse name: Not on file  . Number of children: Not on file  . Years of education: Not on file  . Highest education level: Not on file   Occupational History  . Not on file  Social Needs  . Financial resource strain: Not on file  . Food insecurity:    Worry: Not on file    Inability: Not on file  . Transportation needs:    Medical: Not on file    Non-medical: Not on file  Tobacco Use  . Smoking status: Never Smoker  . Smokeless tobacco: Never Used  Substance and Sexual Activity  . Alcohol use: No  . Drug use: No  . Sexual activity: Not Currently  Lifestyle  . Physical activity:    Days per week: Not on file    Minutes per session: Not on file  . Stress: Not on file  Relationships  . Social connections:    Talks on phone: Not on file    Gets together: Not on file    Attends religious service: Not on file    Active member of club or organization: Not on file    Attends meetings of clubs or organizations: Not on file    Relationship status: Not on file  Other Topics Concern  . Not on file  Social History Narrative  . Not on file    Allergies:  Allergies  Allergen Reactions  . Biaxin [Clarithromycin] Swelling and Other (See Comments)    Reaction:  Facial swelling   . Trulicity [Dulaglutide]     Metabolic Disorder Labs: Lab Results  Component Value Date   HGBA1C 9.9 (H) 01/03/2018   MPG 237 01/03/2018   MPG 283 10/07/2017   No results found for: PROLACTIN Lab Results  Component Value Date   CHOL 166 05/20/2017   TRIG 132 05/20/2017   HDL 52 05/20/2017   CHOLHDL 3.2 05/20/2017   LDLCALC 91 05/20/2017   Lab Results  Component Value Date   TSH 1.22 05/20/2017   TSH 1.30 02/10/2017    Therapeutic Level Labs: No results found for: LITHIUM No results found for: VALPROATE No components found for:  CBMZ  Current Medications: Current Outpatient Medications  Medication Sig Dispense Refill  . atorvastatin (LIPITOR) 20 MG tablet Take 1 tablet (20 mg total) by mouth daily. 30 tablet 3  . baclofen (LIORESAL) 10 MG tablet Take 10 mg by mouth 3 (three) times daily as needed for muscle spasms.      . blood glucose meter kit and supplies KIT Dispense based on patient and insurance preference. Use up to four times daily as directed. (FOR ICD-10 E11.65) . 1 each 2  . Blood Glucose Monitoring Suppl (ONETOUCH VERIO) w/Device KIT 1 each by Does not apply route as needed. 1 kit 0  . Brexpiprazole (REXULTI) 2 MG TABS Take 2 mg by mouth daily. 30 tablet 2  . Cholecalciferol (VITAMIN  D3) 5000 units CAPS TAKE 1 CAPSULE BY MOUTH EVERY DAY 30 capsule 0  . Continuous Blood Gluc Sensor (FREESTYLE LIBRE 14 DAY SENSOR) MISC Inject 1 each into the skin every 14 (fourteen) days. Use as directed. 2 each 2  . DULoxetine (CYMBALTA) 60 MG capsule Take 1 capsule (60 mg total) by mouth 2 (two) times daily. 180 capsule 2  . gabapentin (NEURONTIN) 600 MG tablet Take 600 mg by mouth 3 (three) times daily.    Marland Kitchen glucose blood (CONTOUR NEXT TEST) test strip Use as instructed qid. E11.65 150 each 5  . insulin aspart (NOVOLOG FLEXPEN) 100 UNIT/ML FlexPen Inject 15-21 Units into the skin 3 (three) times daily with meals. 30 mL 2  . insulin aspart (NOVOLOG) 100 UNIT/ML FlexPen Inject 15-21 Units into the skin 3 (three) times daily with meals. 10 pen 2  . Insulin Glargine (LANTUS SOLOSTAR) 100 UNIT/ML Solostar Pen Inject 80 Units into the skin at bedtime. 30 mL 2  . Insulin Glargine 300 UNIT/ML SOPN Inject 80 Units into the skin at bedtime. 9 mL 2  . Insulin Pen Needle (B-D ULTRAFINE III SHORT PEN) 31G X 8 MM MISC 1 each by Does not apply route as directed. 100 each 3  . Lancets MISC 1 each by Does not apply route as directed. 150 each 3  . metFORMIN (GLUCOPHAGE) 1000 MG tablet Take 1 tablet (1,000 mg total) by mouth 2 (two) times daily with a meal. 60 tablet 2  . ONETOUCH VERIO test strip USE TO TEST 3 TIMES DAILY 250 each 2  . oxyCODONE-acetaminophen (PERCOCET) 10-325 MG per tablet Take 1 tablet by mouth every 6 (six) hours as needed for pain.    . traZODone (DESYREL) 150 MG tablet Take 1 tablet (150 mg total) by mouth at  bedtime. 90 tablet 2   No current facility-administered medications for this visit.      Musculoskeletal: Strength & Muscle Tone: within normal limits Gait & Station: normal Patient leans: N/A  Psychiatric Specialty Exam: Review of Systems  Musculoskeletal: Positive for back pain and joint pain.  Psychiatric/Behavioral: Positive for depression.  All other systems reviewed and are negative.   Blood pressure 108/71, pulse (!) 106, height '5\' 6"'$  (1.676 m), weight 202 lb (91.6 kg), last menstrual period 12/25/2014, SpO2 96 %.Body mass index is 32.6 kg/m.  General Appearance: Casual and Fairly Groomed  Eye Contact:  Good  Speech:  Clear and Coherent  Volume:  Normal  Mood:  Dysphoric  Affect:  Congruent  Thought Process:  Goal Directed  Orientation:  Full (Time, Place, and Person)  Thought Content: Rumination   Suicidal Thoughts:  No  Homicidal Thoughts:  No  Memory:  Immediate;   Good Recent;   Good Remote;   Good  Judgement:  Good  Insight:  Good  Psychomotor Activity:  Decreased  Concentration:  Concentration: Fair and Attention Span: Fair  Recall:  Good  Fund of Knowledge: Good  Language: Good  Akathisia:  No  Handed:  Right  AIMS (if indicated): not done  Assets:  Communication Skills Desire for Improvement Resilience Social Support Talents/Skills  ADL's:  Intact  Cognition: WNL  Sleep:  Fair   Screenings: PHQ2-9     Office Visit from 02/09/2018 in [facility] Nutrition from 11/08/2017 in [facility] Office Visit from 10/12/2017 in [facility] Office Visit from 06/02/2017 in [facility] Office Visit from 04/12/2017 in [facility]  PHQ-2 Total Score  0  6  0  0  0  PHQ-9 Total Score  -  22  -  -  -       Assessment and Plan: This patient is a 52 year old female with a history of chronic pain and depression.  Both of these  conditions has made it impossible for her to work which is contributed to financial strain and stress with her wife.  She is feeling somewhat better since we added Rexulti to her regimen.  She will continue Cymbalta 60 mg twice daily for depression and chronic pain, Rexulti 2 mg daily for augmentation and trazodone 150 mg daily at bedtime for sleep.  She will return to see me in 6 weeks   Levonne Spiller, MD 02/16/2018, 9:37 AM

## 2018-02-22 ENCOUNTER — Ambulatory Visit
Admission: RE | Admit: 2018-02-22 | Discharge: 2018-02-22 | Disposition: A | Discharge status: Home / Self Care | Payer: 59 | Source: Ambulatory Visit | Attending: Obstetrics and Gynecology | Admitting: Obstetrics and Gynecology

## 2018-02-22 DIAGNOSIS — Z1231 Encounter for screening mammogram for malignant neoplasm of breast: Secondary | ICD-10-CM

## 2018-02-24 ENCOUNTER — Other Ambulatory Visit: Payer: Self-pay | Admitting: "Endocrinology

## 2018-03-22 ENCOUNTER — Telehealth (HOSPITAL_COMMUNITY): Payer: Self-pay | Admitting: *Deleted

## 2018-03-22 NOTE — Telephone Encounter (Signed)
VIR[place] PREMIER APPROVED REXULTI 2 MG EFFECTIVE: 03/22/18      03/22/19

## 2018-03-30 ENCOUNTER — Encounter (HOSPITAL_COMMUNITY): Payer: Self-pay | Admitting: Psychiatry

## 2018-03-30 ENCOUNTER — Ambulatory Visit (INDEPENDENT_AMBULATORY_CARE_PROVIDER_SITE_OTHER): Payer: 59 | Admitting: Psychiatry

## 2018-03-30 VITALS — BP 111/76 | HR 93 | Ht 66.0 in | Wt 206.0 lb

## 2018-03-30 DIAGNOSIS — F322 Major depressive disorder, single episode, severe without psychotic features: Secondary | ICD-10-CM | POA: Diagnosis not present

## 2018-03-30 MED ORDER — DULOXETINE HCL 60 MG PO CPEP: 60.0000 mg | ORAL_CAPSULE | Freq: Two times a day (BID) | ORAL | 2 refills | Status: DC

## 2018-03-30 MED ORDER — BREXPIPRAZOLE 2 MG PO TABS: 2.0000 mg | ORAL_TABLET | Freq: Every day | ORAL | 2 refills | Status: DC

## 2018-03-30 MED ORDER — TRAZODONE HCL 150 MG PO TABS: 150.0000 mg | ORAL_TABLET | Freq: Every day | ORAL | 2 refills | Status: DC

## 2018-03-30 NOTE — Progress Notes (Signed)
[facility] MD/PA/NP OP Progress Note  03/30/2018 9:28 AM Victoria Mcdowell  MRN:  [id number]  Chief Complaint:  Chief Complaint    Depression; Anxiety; Follow-up     HPI:  this patient is a 52 year old white female who is married to her female partner and lives with her 3 sons in [place]. 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  The patient was referred by her primary physician, Dr. Teryl Lucy for further assessment of depression.  The patient states that she had a bad bout of depression in the late 90s. She saw a psychiatrist and was actually hospitalized in 2000 after she became suicidal. She was somewhat better on a combination of Paxil and Wellbutrin. She stayed on medications for a couple of years but then went back to college and lost her insurance and went off of them. She met her current wife and they decided to have children and the wife had artificial insemination to have the 3 children.  In the interim the patient completed her college degree in animal biology. 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 She since then her back is never really been right in she is in chronic pain and not able to work. She also has to be on oxygen because of sleep difficulties and she is scheduled to have a sleep study. She states that her if makes fun of all these problems that she has and seems to resent her. The wife now works full time in the patient stays at home and apparently is not happy with the situation. They have not been sexually intimate for over a year and did not sleep in the same bed. She feels rejected and unloved. She also feels like a failure due to her medical problems and inability to work.  Over the last couple of years the patient's depressive symptoms have resurfaced. She cries all the time, has no energy he doesn't enjoy anything in her life and is snappy and  irritable with the children. She has had thoughts of "I would rather be dead." She claims however that she would never hurt her self. She denies psychotic symptoms and does not use substances  The patient returns after 6 weeks.  She is now on a combination of Cymbalta and Rexulti.  She thinks it is helping a little bit.  She is not crying all the time and she has a bit more energy.  Things at home are still difficult.  Her wife has been berating her for not finding work.  She overheard her wife telling her oldest son that she does not love her anymore.  On the other hand sometimes her wife is very affectionate and loving so she does not know what to think.  Neither of them can really leave because of financial issues.  She is also had a lot of back pain.  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  She is going to try to see a therapist as soon as possible and currently denies suicidal ideation Visit Diagnosis:    ICD-10-CM   1. Major depressive disorder, single episode, severe without psychotic features ([place]) F32.2     Past Psychiatric History: Hospitalization in 2000 for depression  Past Medical History:  Past Medical History:  Diagnosis Date  . Depression   . Diabetes mellitus, type II ([place])   . Elevated cholesterol     Past Surgical History:  Procedure Laterality Date  . BREAST BIOPSY Left 2016  . CARPAL TUNNEL RELEASE    . KNEE SURGERY    . SPINE SURGERY    . ulner nerve release      Family Psychiatric History: See below  Family History:  Family History  Problem Relation Age of Onset  . Depression Mother   . Hypertension Mother   . Depression Father   . Cancer Father   . Diabetes Father   . Breast cancer Neg Hx     Social History:  Social History   Socioeconomic History  . Marital status: Married    Spouse name: Not on file  . Number of  children: Not on file  . Years of education: Not on file  . Highest education level: Not on file  Occupational History  . Not on file  Social Needs  . Financial resource strain: Not on file  . Food insecurity:    Worry: Not on file    Inability: Not on file  . Transportation needs:    Medical: Not on file    Non-medical: Not on file  Tobacco Use  . Smoking status: Never Smoker  . Smokeless tobacco: Never Used  Substance and Sexual Activity  . Alcohol use: No  . Drug use: No  . Sexual activity: Not Currently  Lifestyle  . Physical activity:    Days per week: Not on file    Minutes per session: Not on file  . Stress: Not on file  Relationships  . Social connections:    Talks on phone: Not on file    Gets together: Not on file    Attends religious service: Not on file    Active member of club or organization: Not on file    Attends meetings of clubs or organizations: Not on file    Relationship status: Not on file  Other Topics Concern  . Not on file  Social History Narrative  . Not on file    Allergies:  Allergies  Allergen Reactions  . Biaxin [Clarithromycin] Swelling and Other (See Comments)    Reaction:  Facial swelling   . Trulicity [Dulaglutide]     Metabolic Disorder Labs: Lab Results  Component Value Date   HGBA1C 9.9 (H) 01/03/2018   MPG 237 01/03/2018   MPG 283 10/07/2017   No results found for: PROLACTIN Lab Results  Component Value Date   CHOL 166 05/20/2017   TRIG 132 05/20/2017   HDL 52 05/20/2017   CHOLHDL 3.2 05/20/2017   LDLCALC 91 05/20/2017   Lab Results  Component Value Date   TSH 1.22 05/20/2017   TSH 1.30 02/10/2017    Therapeutic Level Labs: No results found for: LITHIUM No results found for: VALPROATE No components found for:  CBMZ  Current Medications: Current Outpatient Medications  Medication Sig Dispense Refill  . atorvastatin (LIPITOR) 20 MG tablet Take 1 tablet (20 mg total) by mouth daily. 30 tablet 3  .  baclofen (LIORESAL) 10 MG tablet Take 10 mg by mouth 3 (three) times daily as needed for muscle spasms.     . blood glucose meter kit and supplies KIT Dispense based on patient and insurance preference. Use up to four times daily as directed. (FOR ICD-10 E11.65) . 1 each 2  . Blood Glucose Monitoring Suppl [facility]  VERIO) w/Device KIT 1 each by Does not apply route as needed. 1 kit 0  . Brexpiprazole (REXULTI) 2 MG TABS Take 2 mg by mouth daily. 30 tablet 2  . Cholecalciferol (VITAMIN D3) 5000 units CAPS TAKE 1 CAPSULE BY MOUTH EVERY DAY 30 capsule 0  . Continuous Blood Gluc Sensor (FREESTYLE LIBRE 14 DAY SENSOR) MISC Inject 1 each into the skin every 14 (fourteen) days. Use as directed. 2 each 2  . DULoxetine (CYMBALTA) 60 MG capsule Take 1 capsule (60 mg total) by mouth 2 (two) times daily. 180 capsule 2  . gabapentin (NEURONTIN) 600 MG tablet Take 600 mg by mouth 3 (three) times daily.    Marland Kitchen glucose blood (CONTOUR NEXT TEST) test strip Use as instructed qid. E11.65 150 each 5  . insulin aspart (NOVOLOG FLEXPEN) 100 UNIT/ML FlexPen Inject 15-21 Units into the skin 3 (three) times daily with meals. 30 mL 2  . insulin aspart (NOVOLOG) 100 UNIT/ML FlexPen Inject 15-21 Units into the skin 3 (three) times daily with meals. 10 pen 2  . Insulin Glargine (LANTUS SOLOSTAR) 100 UNIT/ML Solostar Pen Inject 80 Units into the skin at bedtime. 30 mL 2  . Insulin Glargine 300 UNIT/ML SOPN Inject 80 Units into the skin at bedtime. 9 mL 2  . Insulin Pen Needle (B-D ULTRAFINE III SHORT PEN) 31G X 8 MM MISC 1 each by Does not apply route as directed. 100 each 3  . Lancets MISC 1 each by Does not apply route as directed. 150 each 3  . metFORMIN (GLUCOPHAGE) 1000 MG tablet TAKE 1 TABLET BY MOUTH TWICE DAILY WITH MEALS 60 tablet 2  . ONETOUCH VERIO test strip USE TO TEST 3 TIMES DAILY 250 each 2  . oxyCODONE-acetaminophen (PERCOCET) 10-325 MG per tablet Take 1 tablet by mouth every 6 (six) hours as needed for pain.     . traZODone (DESYREL) 150 MG tablet Take 1 tablet (150 mg total) by mouth at bedtime. 90 tablet 2   No current facility-administered medications for this visit.      Musculoskeletal: Strength & Muscle Tone: within normal limits Gait & Station: normal Patient leans: N/A  Psychiatric Specialty Exam: Review of Systems  Musculoskeletal: Positive for back pain.  Psychiatric/Behavioral: The patient is nervous/anxious.   All other systems reviewed and are negative.   Blood pressure 111/76, pulse 93, height '5\' 6"'  (1.676 m), weight 206 lb (93.4 kg), last menstrual period 12/25/2014, SpO2 95 %.Body mass index is 33.25 kg/m.  General Appearance: Casual and Fairly Groomed  Eye Contact:  Good  Speech:  Clear and Coherent  Volume:  Normal  Mood:  Anxious  Affect:  Congruent  Thought Process:  Goal Directed  Orientation:  Full (Time, Place, and Person)  Thought Content: Rumination   Suicidal Thoughts:  No  Homicidal Thoughts:  No  Memory:  Immediate;   Good Recent;   Good Remote;   Good  Judgement:  Good  Insight:  Good  Psychomotor Activity:  Decreased  Concentration:  Concentration: Good and Attention Span: Good  Recall:  Good  Fund of Knowledge: Good  Language: Good  Akathisia:  No  Handed:  Right  AIMS (if indicated): not done  Assets:  Communication Skills Desire for Improvement Resilience Social Support Talents/Skills  ADL's:  Intact  Cognition: WNL  Sleep:  Fair   Screenings: PHQ2-9     Office Visit from 02/09/2018 in [facility] Nutrition from 11/08/2017 in [facility] Visit from  10/12/2017 in [facility] Office Visit from 06/02/2017 in [facility] Office Visit from 04/12/2017 in [facility]  PHQ-2 Total Score  0  6  0  0  0  PHQ-9 Total Score  -  22  -  -  -       Assessment and Plan: This patient is a 52 year old female  with a history of depression anxiety and chronic pain.  She seems to be doing somewhat better with the addition of Rexulti.  She and her partner seem to have a lot of issues between them which need to be worked out.  She is going to pursue therapy.  At the present time she is going to continue Cymbalta 60 mg twice a day for depression and chronic pain, Rexulti 2 mg daily for augmentation and trazodone 150 mg at bedtime for sleep.  She is also being fitted for CPAP to help with sleep.  She will return to see me in 2 months   Levonne Spiller, MD 03/30/2018, 9:28 AM

## 2018-04-05 ENCOUNTER — Other Ambulatory Visit: Payer: Self-pay | Admitting: "Endocrinology

## 2018-04-10 ENCOUNTER — Other Ambulatory Visit: Payer: Self-pay | Admitting: "Endocrinology

## 2018-04-29 ENCOUNTER — Other Ambulatory Visit: Payer: Self-pay | Admitting: "Endocrinology

## 2018-05-02 ENCOUNTER — Other Ambulatory Visit: Payer: Self-pay | Admitting: "Endocrinology

## 2018-05-02 LAB — LIPID PANEL
CHOLESTEROL: 155 (ref 0–200)
HDL: 38 (ref 35–70)
LDL Cholesterol: 76
Triglycerides: 204 — AB (ref 40–160)

## 2018-05-02 LAB — HEMOGLOBIN A1C: Hgb A1c MFr Bld: 8.9 — AB (ref 4.0–6.0)

## 2018-05-02 LAB — BASIC METABOLIC PANEL
BUN: 11 (ref 4–21)
Creatinine: 0.6 (ref 0.5–1.1)

## 2018-05-02 LAB — TSH: TSH: 3.89 (ref 0.41–5.90)

## 2018-05-02 LAB — MICROALBUMIN, URINE: MICROALB UR: 4.8

## 2018-05-08 ENCOUNTER — Other Ambulatory Visit: Payer: Self-pay | Admitting: "Endocrinology

## 2018-05-16 ENCOUNTER — Encounter: Payer: 59 | Attending: Family Medicine | Admitting: Nutrition

## 2018-05-16 ENCOUNTER — Encounter: Payer: Self-pay | Admitting: Nutrition

## 2018-05-16 ENCOUNTER — Ambulatory Visit (INDEPENDENT_AMBULATORY_CARE_PROVIDER_SITE_OTHER): Payer: 59 | Admitting: "Endocrinology

## 2018-05-16 ENCOUNTER — Encounter: Payer: Self-pay | Admitting: "Endocrinology

## 2018-05-16 VITALS — BP 119/77 | HR 96 | Ht 66.0 in | Wt 215.0 lb

## 2018-05-16 VITALS — Ht 67.0 in | Wt 215.0 lb

## 2018-05-16 DIAGNOSIS — E782 Mixed hyperlipidemia: Secondary | ICD-10-CM | POA: Diagnosis not present

## 2018-05-16 DIAGNOSIS — E669 Obesity, unspecified: Secondary | ICD-10-CM

## 2018-05-16 DIAGNOSIS — E559 Vitamin D deficiency, unspecified: Secondary | ICD-10-CM

## 2018-05-16 DIAGNOSIS — IMO0002 Reserved for concepts with insufficient information to code with codable children: Secondary | ICD-10-CM

## 2018-05-16 DIAGNOSIS — E1165 Type 2 diabetes mellitus with hyperglycemia: Secondary | ICD-10-CM | POA: Diagnosis present

## 2018-05-16 DIAGNOSIS — E118 Type 2 diabetes mellitus with unspecified complications: Secondary | ICD-10-CM

## 2018-05-16 DIAGNOSIS — Z713 Dietary counseling and surveillance: Secondary | ICD-10-CM | POA: Insufficient documentation

## 2018-05-16 MED ORDER — SEMAGLUTIDE(0.25 OR 0.5MG/DOS) 2 MG/1.5ML ~~LOC~~ SOPN: 0.5000 mg | PEN_INJECTOR | SUBCUTANEOUS | 2 refills | Status: AC

## 2018-05-16 NOTE — Patient Instructions (Addendum)
[place]  1 Contact PCP about fluid in legs 2. Increase low car b veggies with lunch an dinner 3. Increase water intake to 84 oz per day.  Keep exercising  30 minutes  3 times per week.'

## 2018-05-16 NOTE — Progress Notes (Signed)
  Medical Nutrition Therapy:  Appt start time: 1000 end time:  1030  Assessment:  Primary concerns today: Diabetes Type 2.. Lives with her wife.  Has 3 sons.   No longer has boot on foot. Has 2+ edema sweeling in both legs. Saw Dr, Fransico HimNIda today. Advised to check with PCP about swelling.  A1C better 8.9% from 9.9%. Struggles with getting meals in time due to kids sports and games. Gained 15 lbs in the last 2 months. 2+ swelling in both lower legs, Is now using a Cpap machine and it has helped her sleep a lot better. Getting more like 8 hrs of sleep most every night.    Willing to continue working on more food changes and increased exercise. Starting on Ozempic  Has the libre and check bs 4 times per day. Lantus 80 units a day and Novolog with meal 15 units + SS. Vitals with BMI 05/16/2018  Height 5\' 7"   Weight 215 lbs  BMI 33.67  Systolic   Diastolic   Pulse   Respirations    Preferred Learning Style:   No preference indicated   Learning Readiness:    Ready  Change in progress   MEDICATIONS: See list   DIETARY INTAKE:  24-hr recall:  B ( AM): Slim fast and fruit or bowl of shredded or oatbran or special k with strawberries or eggs and toat Snk ( AM):  L ( PM)  [place], cheese, l/t with whole bread or white bread D ( PM): Pork chop,slim fast Snk ( PM):  Beverages: water, milk,  Usual physical activity: Walks some-plans to join PF.  Estimated energy needs: 1500  calories 170  g carbohydrates 112 g protein 42 g fat  Progress Towards Goal(s):  In progress.   Nutritional Diagnosis:  NB-1.1 Food and nutrition-related knowledge deficit As related to Diabetes.  As evidenced by A1C 10.6%.    Intervention:  Nutrition and Diabetes education provided on My Plate, CHO counting, meal planning, portion sizes, timing of meals, avoiding snacks between meals unless having a low blood sugar, target ranges for A1C and blood sugars, signs/symptoms and treatment of hyper/hypoglycemia,  monitoring blood sugars, taking medications as prescribed, benefits of exercising 30 minutes per day and prevention of complications of DM. Marland Kitchen. Goal 1. Try not to skip meals. 2. Increase fresh fruits and more lower carb vegetables. 3. When allowed, get back to walking.  Lose 1-2 lbs per week Take insulin as prescribed.  Teaching Method Utilized:  Visual Auditory Hands on  Handouts given during visit include:  The Plate Method   Meal Plan Card  Barriers to learning/adherence to lifestyle change:  none  Demonstrated degree of understanding via:  Teach Back   Monitoring/Evaluation:  Dietary intake, exercise,  Meal planing, , and body weight in 3 month(s).

## 2018-05-16 NOTE — Patient Instructions (Signed)

## 2018-05-16 NOTE — Progress Notes (Signed)
Endocrinology follow-up note   Subjective:    Patient ID: Victoria Mcdowell, female    DOB: 1966/04/11. Patient is being seen in follow-up for management of chronically uncontrolled symptomatic type 2 diabetes, hyperlipidemia, hypertension.   PCP:   Beola Cord, FNP  Past Medical History:  Diagnosis Date  . Depression   . Diabetes mellitus, type II ([place])   . Elevated cholesterol    Past Surgical History:  Procedure Laterality Date  . BREAST BIOPSY Left 2016  . CARPAL TUNNEL RELEASE    . KNEE SURGERY    . SPINE SURGERY    . ulner nerve release     Social History   Socioeconomic History  . Marital status: Married    Spouse name: Not on file  . Number of children: Not on file  . Years of education: Not on file  . Highest education level: Not on file  Occupational History  . Not on file  Social Needs  . Financial resource strain: Not on file  . Food insecurity:    Worry: Not on file    Inability: Not on file  . Transportation needs:    Medical: Not on file    Non-medical: Not on file  Tobacco Use  . Smoking status: Never Smoker  . Smokeless tobacco: Never Used  Substance and Sexual Activity  . Alcohol use: No  . Drug use: No  . Sexual activity: Not Currently  Lifestyle  . Physical activity:    Days per week: Not on file    Minutes per session: Not on file  . Stress: Not on file  Relationships  . Social connections:    Talks on phone: Not on file    Gets together: Not on file    Attends religious service: Not on file    Active member of club or organization: Not on file    Attends meetings of clubs or organizations: Not on file    Relationship status: Not on file  Other Topics Concern  . Not on file  Social History Narrative  . Not on file   Outpatient Encounter Medications as of 05/16/2018  Medication Sig  . atorvastatin (LIPITOR) 20 MG tablet TAKE 1 TABLET BY MOUTH ONCE DAILY  . baclofen (LIORESAL) 10 MG tablet Take 10 mg by mouth 3 (three) times  daily as needed for muscle spasms.   . blood glucose meter kit and supplies KIT Dispense based on patient and insurance preference. Use up to four times daily as directed. (FOR ICD-10 E11.65) .  Marland Kitchen Blood Glucose Monitoring Suppl (ONETOUCH VERIO) w/Device KIT 1 each by Does not apply route as needed.  . Brexpiprazole (REXULTI) 2 MG TABS Take 2 mg by mouth daily.  . Cholecalciferol (VITAMIN D3) 5000 units CAPS TAKE 1 CAPSULE BY MOUTH EVERY DAY  . Continuous Blood Gluc Sensor (FREESTYLE LIBRE 14 DAY SENSOR) MISC USE AS DIRECTED EVERY  14  DAYS  . DULoxetine (CYMBALTA) 60 MG capsule Take 1 capsule (60 mg total) by mouth 2 (two) times daily.  Marland Kitchen gabapentin (NEURONTIN) 600 MG tablet Take 600 mg by mouth 3 (three) times daily.  Marland Kitchen glucose blood (CONTOUR NEXT TEST) test strip Use as instructed qid. E11.65  . insulin aspart (NOVOLOG FLEXPEN) 100 UNIT/ML FlexPen Inject 15-21 Units into the skin 3 (three) times daily with meals.  . Insulin Glargine 300 UNIT/ML SOPN Inject 80 Units into the skin at bedtime.  . Insulin Pen Needle 31G X 8 MM MISC USE AS DIRECTED  .  Lancets MISC 1 each by Does not apply route as directed.  . metFORMIN (GLUCOPHAGE) 1000 MG tablet TAKE 1 TABLET BY MOUTH TWICE DAILY WITH MEALS  . ONETOUCH VERIO test strip USE TO TEST 3 TIMES DAILY  . oxyCODONE-acetaminophen (PERCOCET) 10-325 MG per tablet Take 1 tablet by mouth every 6 (six) hours as needed for pain.  . Semaglutide,0.25 or 0.5MG/DOS, (OZEMPIC, 0.25 OR 0.5 MG/DOSE,) 2 MG/1.5ML SOPN Inject 0.5 mg into the skin once a week.  . traZODone (DESYREL) 150 MG tablet Take 1 tablet (150 mg total) by mouth at bedtime.  . [DISCONTINUED] insulin aspart (NOVOLOG) 100 UNIT/ML FlexPen Inject 15-21 Units into the skin 3 (three) times daily with meals.  . [DISCONTINUED] LANTUS SOLOSTAR 100 UNIT/ML Solostar Pen INJECT 80 UNITS SUBCUTANEOUSLY AT BEDTIME  . [DISCONTINUED] NOVOLOG FLEXPEN 100 UNIT/ML FlexPen INJECT 15-21 UNITS SUBCUTANEOUSLY THREE TIMES A  DAY WITH MEALS   No facility-administered encounter medications on file as of 05/16/2018.    ALLERGIES: Allergies  Allergen Reactions  . Biaxin [Clarithromycin] Swelling and Other (See Comments)    Reaction:  Facial swelling   . Trulicity [Dulaglutide]    VACCINATION STATUS:  There is no immunization history on file for this patient.  Diabetes  She presents for her follow-up diabetic visit. She has type 2 diabetes mellitus. Her disease course has been stable. There are no hypoglycemic associated symptoms. Pertinent negatives for hypoglycemia include no confusion, headaches, pallor or seizures. Associated symptoms include polydipsia and polyuria. Pertinent negatives for diabetes include no blurred vision, no chest pain, no fatigue, no foot paresthesias and no polyphagia. There are no hypoglycemic complications. Symptoms are stable. Diabetic complications include peripheral neuropathy. Risk factors for coronary artery disease include family history, dyslipidemia, diabetes mellitus, hypertension and sedentary lifestyle. Current diabetic treatment includes insulin injections (She says she was put on Humulin 70/30-55 units twice a day, however she admits she has not been consistent taking this much insulin.). She is compliant with treatment most of the time. Her weight is increasing steadily. She is following a generally unhealthy diet. When asked about meal planning, she reported none. She has not had a previous visit with a dietitian. She participates in exercise intermittently. Her home blood glucose trend is decreasing steadily. Her breakfast blood glucose range is generally 180-200 mg/dl. Her lunch blood glucose range is generally >200 mg/dl. Her dinner blood glucose range is generally >200 mg/dl. Her bedtime blood glucose range is generally >200 mg/dl. Her overall blood glucose range is >200 mg/dl. An ACE inhibitor/angiotensin II receptor blocker is not being taken. Eye exam is current (She denies  any retinopathy.).  Hyperlipidemia  This is a chronic problem. The current episode started more than 1 year ago. The problem is uncontrolled. Recent lipid tests were reviewed and are high. Exacerbating diseases include diabetes and obesity. Pertinent negatives include no chest pain, myalgias or shortness of breath. Current antihyperlipidemic treatment includes statins. Risk factors for coronary artery disease include dyslipidemia, diabetes mellitus, family history, hypertension, a sedentary lifestyle and obesity.  Hypertension  This is a chronic problem. The current episode started more than 1 year ago. The problem is controlled. Pertinent negatives include no blurred vision, chest pain, headaches, palpitations or shortness of breath. Risk factors for coronary artery disease include diabetes mellitus, dyslipidemia, obesity, sedentary lifestyle and post-menopausal state. Past treatments include nothing.     Review of Systems  Constitutional: Negative for chills, fatigue, fever and unexpected weight change.  HENT: Negative for trouble swallowing and voice change.  Eyes: Negative for blurred vision and visual disturbance.  Respiratory: Negative for cough, shortness of breath and wheezing.   Cardiovascular: Negative for chest pain, palpitations and leg swelling.  Gastrointestinal: Negative for diarrhea, nausea and vomiting.  Endocrine: Positive for polydipsia and polyuria. Negative for cold intolerance, heat intolerance and polyphagia.  Musculoskeletal: Negative for arthralgias and myalgias.  Skin: Negative for color change, pallor, rash and wound.  Neurological: Negative for seizures and headaches.  Psychiatric/Behavioral: Negative for confusion and suicidal ideas.    Objective:    BP 119/77   Pulse 96   Ht '5\' 6"'  (1.676 m)   Wt 215 lb (97.5 kg)   LMP 12/25/2014   BMI 34.70 kg/m   Wt Readings from Last 3 Encounters:  05/16/18 215 lb (97.5 kg)  05/16/18 215 lb (97.5 kg)  02/09/18 199 lb  (90.3 kg)    Physical Exam  Constitutional: She is oriented to person, place, and time. She appears well-developed.  HENT:  Head: Normocephalic and atraumatic.  Dry mucous membranes.  Eyes: EOM are normal.  Neck: Normal range of motion. Neck supple. No tracheal deviation present. No thyromegaly present.  Cardiovascular: Normal rate and regular rhythm.  Pulmonary/Chest: Effort normal and breath sounds normal.  Abdominal: Soft. Bowel sounds are normal. There is no tenderness. There is no guarding.  Musculoskeletal: Normal range of motion. She exhibits edema.  Neurological: She is alert and oriented to person, place, and time. She has normal reflexes. No cranial nerve deficit. Coordination normal.  Skin: Skin is warm and dry. No rash noted. No erythema. No pallor.  Psychiatric: She has a normal mood and affect.    CMP     Component Value Date/Time   NA 141 01/03/2018 1017   K 4.8 01/03/2018 1017   CL 104 01/03/2018 1017   CO2 29 01/03/2018 1017   GLUCOSE 205 (H) 01/03/2018 1017   BUN 11 05/02/2018   CREATININE 0.6 05/02/2018   CREATININE 0.67 01/03/2018 1017   CALCIUM 9.0 01/03/2018 1017   PROT 6.6 01/03/2018 1017   AST 12 01/03/2018 1017   ALT 12 01/03/2018 1017   BILITOT 0.2 01/03/2018 1017   GFRNONAA 100 01/03/2018 1017   GFRAA 116 01/03/2018 1017   Diabetic Labs (most recent): Lab Results  Component Value Date   HGBA1C 8.9 (A) 05/02/2018   HGBA1C 9.9 (H) 01/03/2018   HGBA1C 11.5 (H) 10/07/2017     Lipid Panel     Component Value Date/Time   CHOL 155 05/02/2018   TRIG 204 (A) 05/02/2018   HDL 38 05/02/2018   CHOLHDL 3.2 05/20/2017 1112   LDLCALC 76 05/02/2018   LDLCALC 91 05/20/2017 1112     Assessment & Plan:   1. Uncontrolled type 2 diabetes mellitus with complication, with long-term current use of insulin ([place])  - Patient has currently uncontrolled symptomatic type 2 DM since  52 years of age. -She came with improving A1c of 8.9%, progressively  improving from  >15.5%. -Presents with her complete logs, using her CGM.  Analysis showed 49% time range, 50% above range largely postprandial.   Recent labs reviewed.   Her diabetes is complicated by  peripheral neuropathy, depression and patient remains at a high risk for more acute and chronic complications which include CAD, CVA, CKD, retinopathy, and neuropathy. These are all discussed in detail with the patient.  - I have counseled the patient on diet management and weight loss, by adopting a carbohydrate restricted/protein rich diet.  -She still admits to dietary  indiscretion including consumption of sweetened beverages and use of artificial sweeteners.  -  Suggestion is made for her to avoid simple carbohydrates  from her diet including Cakes, Sweet Desserts / Pastries, Ice Cream, Soda (diet and regular), Sweet Tea, Candies, Chips, Cookies, Store Bought Juices, Alcohol in Excess of  1-2 drinks a day, Artificial Sweeteners, and "Sugar-free" Products. This will help patient to have stable blood glucose profile and potentially avoid unintended weight gain.   - I encouraged the patient to switch to  unprocessed or minimally processed complex starch and increased protein intake (animal or plant source), fruits, and vegetables.  - Patient is advised to stick to a routine mealtimes to eat 3 meals  a day and avoid unnecessary snacks ( to snack only to correct hypoglycemia).  - I have approached patient with the following individualized plan to manage diabetes and patient agrees:   -She has responded to intensive treatment with basal/bolus insulin, and she is counseled to stay engaged for same insulin program. -Based on her presentation with 50% above target readings postprandially, she would require higher dose of NovoLog.  However, she promises to do better on her diet by consuming less carbs and wishes to keep her insulin doses the same.   -I advised her to continue Toujeo 80 units nightly,  continue NovoLog  15 units 3 times daily before meals for pre-meal blood glucose above 90 mg/dL with additional correction for readings above 150 mg/dL, associated with strict monitoring of blood glucose 4 times a day-before meals and at bedtime .  - Patient is warned not to take insulin without proper monitoring per orders.  -Patient is encouraged to call clinic for blood glucose levels less than 70 or above 300 mg /dl. -She will benefit from continuous glucose monitoring.  I discussed and prescribed the freestyle libre device for her. - I will continue metformin  1000 mg by mouth twice a day , therapeutically suitable for patient . -She is interested in prescription for Ozempic.  I discussed and initiated Ozempic 0.25 mg subcutaneously weekly x2 weeks, followed by 0.5 mg subclinically weekly if tolerated subsequently.  History of skin itching with a trial of Trulicity in the past.  - Patient specific target  A1c;  LDL, HDL, Triglycerides, and  Waist Circumference were discussed in detail.  2) BP/HTN: Her blood pressure is controlled to target.    She is currently not on any antihypertensive medications.   3) Lipids/HPL: Her recent lipid panel showed controlled LDL of 76 improving from 91, and triglyceride level of 204, generally improved improving from 404.   She is advised to continue atorvastatin 20 mg p.o. nightly.   4)  Weight/Diet: CDE Consult has been initiated , exercise, and detailed carbohydrates information provided.  5) vitamin D deficiency:  -   She was recently initiated on  vitamin D 3 5000 units daily for the next 90 days, she is advised to finish.  6) Chronic Care/Health Maintenance:  -Patient is not on  ACEI/ARB and  started Statin medications and encouraged to continue to follow up with Ophthalmology, Podiatrist at least yearly or according to recommendations, and advised to   stay away from smoking. I have recommended yearly flu vaccine and pneumonia vaccination at least  every 5 years; moderate intensity exercise for up to 150 minutes weekly; and  sleep for at least 7 hours a day.  - I advised patient to maintain close follow up with Beola Cord, FNP for primary care needs.  She  may need cardiac evaluation with echocardiogram given bilateral lower extremity edema.  - Time spent with the patient: 25 min, of which >50% was spent in reviewing her blood glucose logs , discussing her hypo- and hyper-glycemic episodes, reviewing her current and  previous labs and insulin doses and developing a plan to avoid hypo- and hyper-glycemia. Please refer to Patient Instructions for Blood Glucose Monitoring and Insulin/Medications Dosing Guide"  in media tab for additional information. Victoria Mcdowell participated in the discussions, expressed understanding, and voiced agreement with the above plans.  All questions were answered to her satisfaction. she is encouraged to contact clinic should she have any questions or concerns prior to her return visit.     Follow up plan: - Return in about 3 months (around 08/15/2018) for Meter, and Logs, Follow up with Pre-visit Labs, Meter, and Logs.  Glade Lloyd, MD Phone: [phone]  Fax: [phone]   This note was partially dictated with voice recognition software. Similar sounding words can be transcribed inadequately or may not  be corrected upon review.  05/16/2018, 12:51 PM

## 2018-05-30 ENCOUNTER — Ambulatory Visit (HOSPITAL_COMMUNITY): Payer: Self-pay | Admitting: Psychiatry

## 2018-06-06 ENCOUNTER — Ambulatory Visit (INDEPENDENT_AMBULATORY_CARE_PROVIDER_SITE_OTHER): Payer: 59 | Admitting: Psychiatry

## 2018-06-06 ENCOUNTER — Encounter (HOSPITAL_COMMUNITY): Payer: Self-pay | Admitting: Psychiatry

## 2018-06-06 VITALS — BP 116/78 | HR 100 | Ht 67.0 in | Wt 210.0 lb

## 2018-06-06 DIAGNOSIS — F322 Major depressive disorder, single episode, severe without psychotic features: Secondary | ICD-10-CM

## 2018-06-06 DIAGNOSIS — Z560 Unemployment, unspecified: Secondary | ICD-10-CM | POA: Diagnosis not present

## 2018-06-06 MED ORDER — DULOXETINE HCL 60 MG PO CPEP: 60.0000 mg | ORAL_CAPSULE | Freq: Two times a day (BID) | ORAL | 2 refills | Status: AC

## 2018-06-06 MED ORDER — TRAZODONE HCL 150 MG PO TABS: 150.0000 mg | ORAL_TABLET | Freq: Every day | ORAL | 2 refills | Status: AC

## 2018-06-06 MED ORDER — BREXPIPRAZOLE 2 MG PO TABS: 2.0000 mg | ORAL_TABLET | Freq: Every day | ORAL | 2 refills | Status: AC

## 2018-06-06 NOTE — Progress Notes (Signed)
[facility] MD/PA/NP OP Progress Note  06/06/2018 9:14 AM Victoria Mcdowell  MRN:  [id number]  Chief Complaint:  Chief Complaint    Depression; Anxiety; Follow-up     HPI:  this patient is a 52 year old white female who is married to her female partner and lives with her 3 sons in [place]. 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  The patient was referred by her primary physician, Dr. Teryl Lucy for further assessment of depression.  The patient states that she had a bad bout of depression in the late 90s. She saw a psychiatrist and was actually hospitalized in 2000 after she became suicidal. She was somewhat better on a combination of Paxil and Wellbutrin. She stayed on medications for a couple of years but then went back to college and lost her insurance and went off of them. She met her current wife and they decided to have children and the wife had artificial insemination to have the 3 children.  In the interim the patient completed her college degree in animal biology. 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 She since then her back is never really been right in she is in chronic pain and not able to work. She also has to be on oxygen because of sleep difficulties and she is scheduled to have a sleep study. She states that her if makes fun of all these problems that she has and seems to resent her. The wife now works full time in the patient stays at home and apparently is not happy with the situation. They have not been sexually intimate for over a year and did not sleep in the same bed. She feels rejected and unloved. She also feels like a failure due to her medical problems and inability to work.  Over the last couple of years the patient's depressive symptoms have resurfaced. She cries all the time, has no energy he doesn't enjoy anything in her life and is snappy and  irritable with the children. She has had thoughts of "I would rather be dead." She claims however that she would never hurt her self. She denies psychotic symptoms and does not use substances  The patient returns after 2 months.  She seems to be doing better.  Her depression has improved and she does not seem as stressed.  She states that her wife is lightened up on her and is not pushing her as hard to come up with a job or more money for the family.  On the other hand the patient herself is realized that she would feel better if she was out working at least part-time because she feels too isolated at home.  She has been starting to look for part-time jobs.  She is sleeping much better with the CPAP and this may have made a difference in her mood and general well-being.  She denies any serious depression or suicidal ideation today and feels like her medications are working well. Visit Diagnosis:    ICD-10-CM   1. Major depressive disorder, single episode, severe without psychotic features ([place]) F32.2     Past Psychiatric History: Hospitalization in 2000 for depression  Past Medical History:  Past Medical History:  Diagnosis Date  . Depression   . Diabetes mellitus, type II ([place])   . Elevated cholesterol     Past Surgical History:  Procedure Laterality Date  .  BREAST BIOPSY Left 2016  . CARPAL TUNNEL RELEASE    . KNEE SURGERY    . SPINE SURGERY    . ulner nerve release      Family Psychiatric History: See below  Family History:  Family History  Problem Relation Age of Onset  . Depression Mother   . Hypertension Mother   . Depression Father   . Cancer Father   . Diabetes Father   . Breast cancer Neg Hx     Social History:  Social History   Socioeconomic History  . Marital status: Married    Spouse name: Not on file  . Number of children: Not on file  . Years of education: Not on file  . Highest education level: Not on file  Occupational History  . Not on file  Social  Needs  . Financial resource strain: Not on file  . Food insecurity:    Worry: Not on file    Inability: Not on file  . Transportation needs:    Medical: Not on file    Non-medical: Not on file  Tobacco Use  . Smoking status: Never Smoker  . Smokeless tobacco: Never Used  Substance and Sexual Activity  . Alcohol use: No  . Drug use: No  . Sexual activity: Not Currently  Lifestyle  . Physical activity:    Days per week: Not on file    Minutes per session: Not on file  . Stress: Not on file  Relationships  . Social connections:    Talks on phone: Not on file    Gets together: Not on file    Attends religious service: Not on file    Active member of club or organization: Not on file    Attends meetings of clubs or organizations: Not on file    Relationship status: Not on file  Other Topics Concern  . Not on file  Social History Narrative  . Not on file    Allergies:  Allergies  Allergen Reactions  . Biaxin [Clarithromycin] Swelling and Other (See Comments)    Reaction:  Facial swelling   . Trulicity [Dulaglutide]     Metabolic Disorder Labs: Lab Results  Component Value Date   HGBA1C 8.9 (A) 05/02/2018   MPG 237 01/03/2018   MPG 283 10/07/2017   No results found for: PROLACTIN Lab Results  Component Value Date   CHOL 155 05/02/2018   TRIG 204 (A) 05/02/2018   HDL 38 05/02/2018   CHOLHDL 3.2 05/20/2017   LDLCALC 76 05/02/2018   LDLCALC 91 05/20/2017   Lab Results  Component Value Date   TSH 3.89 05/02/2018   TSH 1.22 05/20/2017    Therapeutic Level Labs: No results found for: LITHIUM No results found for: VALPROATE No components found for:  CBMZ  Current Medications: Current Outpatient Medications  Medication Sig Dispense Refill  . atorvastatin (LIPITOR) 20 MG tablet TAKE 1 TABLET BY MOUTH ONCE DAILY 30 tablet 3  . baclofen (LIORESAL) 10 MG tablet Take 10 mg by mouth 3 (three) times daily as needed for muscle spasms.     . blood glucose meter kit  and supplies KIT Dispense based on patient and insurance preference. Use up to four times daily as directed. (FOR ICD-10 E11.65) . 1 each 2  . Blood Glucose Monitoring Suppl (ONETOUCH VERIO) w/Device KIT 1 each by Does not apply route as needed. 1 kit 0  . Brexpiprazole (REXULTI) 2 MG TABS Take 2 mg by mouth daily. 30 tablet 2  .  Cholecalciferol (VITAMIN D3) 5000 units CAPS TAKE 1 CAPSULE BY MOUTH EVERY DAY 30 capsule 0  . Continuous Blood Gluc Sensor (FREESTYLE LIBRE 14 DAY SENSOR) MISC USE AS DIRECTED EVERY  14  DAYS 2 each 2  . DULoxetine (CYMBALTA) 60 MG capsule Take 1 capsule (60 mg total) by mouth 2 (two) times daily. 180 capsule 2  . gabapentin (NEURONTIN) 600 MG tablet Take 600 mg by mouth 3 (three) times daily.    Marland Kitchen glucose blood (CONTOUR NEXT TEST) test strip Use as instructed qid. E11.65 150 each 5  . insulin aspart (NOVOLOG FLEXPEN) 100 UNIT/ML FlexPen Inject 15-21 Units into the skin 3 (three) times daily with meals. 30 mL 2  . Insulin Glargine 300 UNIT/ML SOPN Inject 80 Units into the skin at bedtime. 9 mL 2  . Insulin Pen Needle 31G X 8 MM MISC USE AS DIRECTED 100 each 3  . Lancets MISC 1 each by Does not apply route as directed. 150 each 3  . metFORMIN (GLUCOPHAGE) 1000 MG tablet TAKE 1 TABLET BY MOUTH TWICE DAILY WITH MEALS 60 tablet 2  . ONETOUCH VERIO test strip USE TO TEST 3 TIMES DAILY 250 each 2  . oxyCODONE-acetaminophen (PERCOCET) 10-325 MG per tablet Take 1 tablet by mouth every 6 (six) hours as needed for pain.    . Semaglutide,0.25 or 0.5MG/DOS, (OZEMPIC, 0.25 OR 0.5 MG/DOSE,) 2 MG/1.5ML SOPN Inject 0.5 mg into the skin once a week. 1 pen 2  . traZODone (DESYREL) 150 MG tablet Take 1 tablet (150 mg total) by mouth at bedtime. 90 tablet 2   No current facility-administered medications for this visit.      Musculoskeletal: Strength & Muscle Tone: within normal limits Gait & Station: normal Patient leans: N/A  Psychiatric Specialty Exam: Review of Systems   Musculoskeletal: Positive for back pain.  All other systems reviewed and are negative.   Blood pressure 116/78, pulse 100, height '5\' 7"'  (1.702 m), weight 210 lb (95.3 kg), last menstrual period 12/25/2014, SpO2 95 %.Body mass index is 32.89 kg/m.  General Appearance: Casual, Neat and Well Groomed  Eye Contact:  Good  Speech:  Clear and Coherent  Volume:  Normal  Mood:  Euthymic  Affect:  Congruent  Thought Process:  Goal Directed  Orientation:  Full (Time, Place, and Person)  Thought Content: WDL   Suicidal Thoughts:  No  Homicidal Thoughts:  No  Memory:  Immediate;   Good Recent;   Good Remote;   Good  Judgement:  Good  Insight:  Good  Psychomotor Activity:  Normal  Concentration:  Concentration: Good and Attention Span: Good  Recall:  Good  Fund of Knowledge: Good  Language: Good  Akathisia:  No  Handed:  Right  AIMS (if indicated): not done  Assets:  Communication Skills Desire for Improvement Resilience Social Support Talents/Skills Vocational/Educational  ADL's:  Intact  Cognition: WNL  Sleep:  Good   Screenings: PHQ2-9     Nutrition from 05/16/2018 in [facility] Most recent reading at 05/16/2018  2:23 PM Office Visit from 05/16/2018 in [facility] Most recent reading at 05/16/2018 10:25 AM Office Visit from 02/09/2018 in [facility] Most recent reading at 02/09/2018 10:54 AM Nutrition from 11/08/2017 in [facility] Most recent reading at 11/08/2017 11:42 AM Office Visit from 10/12/2017 in [facility] Most recent reading at 10/12/2017  3:04 PM  PHQ-2 Total Score  0  0  0  6  0  PHQ-9 Total Score  -  -  -  22  -       Assessment and Plan: This patient is a 52 year old female with a history of depression and anxiety.  She seems to be doing better with the addition of CPAP and also since we added Rexulti for  augmentation.  She will continue Cymbalta 60 mg twice daily for depression and chronic pain, Rexulti 2 mg daily for augmentation and trazodone 150 mg at bedtime for sleep.  She will return to see me in 3 months   Levonne Spiller, MD 06/06/2018, 9:14 AM

## 2018-07-17 ENCOUNTER — Other Ambulatory Visit: Payer: Self-pay | Admitting: "Endocrinology

## 2018-07-20 ENCOUNTER — Other Ambulatory Visit: Payer: Self-pay | Admitting: "Endocrinology

## 2018-07-21 ENCOUNTER — Other Ambulatory Visit: Payer: Self-pay

## 2018-07-24 ENCOUNTER — Other Ambulatory Visit: Payer: Self-pay | Admitting: "Endocrinology

## 2018-08-03 ENCOUNTER — Telehealth: Payer: Self-pay | Admitting: "Endocrinology

## 2018-08-03 ENCOUNTER — Other Ambulatory Visit: Payer: Self-pay

## 2018-08-03 MED ORDER — METFORMIN HCL 1000 MG PO TABS: 1000.0000 mg | ORAL_TABLET | Freq: Two times a day (BID) | ORAL | 2 refills | Status: DC

## 2018-08-03 MED ORDER — INSULIN PEN NEEDLE 31G X 8 MM MISC: 3 refills | Status: AC

## 2018-08-03 MED ORDER — INSULIN PEN NEEDLE 31G X 8 MM MISC: 3 refills | Status: DC

## 2018-08-03 MED ORDER — INSULIN ASPART 100 UNIT/ML FLEXPEN: 15.0000 [IU] | PEN_INJECTOR | Freq: Three times a day (TID) | SUBCUTANEOUS | 2 refills | Status: DC

## 2018-08-03 MED ORDER — INSULIN ASPART 100 UNIT/ML FLEXPEN: 15.0000 [IU] | PEN_INJECTOR | Freq: Three times a day (TID) | SUBCUTANEOUS | 2 refills | Status: AC

## 2018-08-03 MED ORDER — INSULIN GLARGINE 100 UNIT/ML SOLOSTAR PEN: 80.0000 [IU] | PEN_INJECTOR | Freq: Every day | SUBCUTANEOUS | 2 refills | Status: AC

## 2018-08-03 MED ORDER — INSULIN GLARGINE 100 UNIT/ML SOLOSTAR PEN: 80.0000 [IU] | PEN_INJECTOR | Freq: Every day | SUBCUTANEOUS | 2 refills | Status: DC

## 2018-08-03 MED ORDER — METFORMIN HCL 1000 MG PO TABS: 1000.0000 mg | ORAL_TABLET | Freq: Two times a day (BID) | ORAL | 2 refills | Status: AC

## 2018-08-03 NOTE — Telephone Encounter (Signed)
Rx sent 

## 2018-08-03 NOTE — Telephone Encounter (Signed)
Pt needs Lantus, metformin, and needles, she is out

## 2018-08-16 ENCOUNTER — Ambulatory Visit: Payer: 59 | Admitting: "Endocrinology

## 2018-08-16 ENCOUNTER — Ambulatory Visit: Payer: Self-pay | Admitting: Nutrition

## 2018-09-06 ENCOUNTER — Ambulatory Visit (HOSPITAL_COMMUNITY): Payer: PRIVATE HEALTH INSURANCE | Admitting: Psychiatry

## 2018-09-15 ENCOUNTER — Ambulatory Visit (HOSPITAL_COMMUNITY): Payer: PRIVATE HEALTH INSURANCE | Admitting: Psychiatry

## 2018-11-21 ENCOUNTER — Other Ambulatory Visit: Payer: Self-pay | Admitting: "Endocrinology

## 2018-12-25 ENCOUNTER — Other Ambulatory Visit: Payer: Self-pay | Admitting: "Endocrinology

## 2018-12-27 ENCOUNTER — Telehealth (HOSPITAL_COMMUNITY): Payer: Self-pay | Admitting: *Deleted

## 2018-12-27 NOTE — Telephone Encounter (Signed)
[facility]   [place]  [facility]   # [phone]  APPROVED  REXULTI  2 MG TABLET    APPROVAL FROM:  12/26/2018   TO      12/26/2019

## 2019-01-24 ENCOUNTER — Other Ambulatory Visit: Payer: Self-pay | Admitting: Obstetrics and Gynecology

## 2019-01-24 DIAGNOSIS — Z1231 Encounter for screening mammogram for malignant neoplasm of breast: Secondary | ICD-10-CM

## 2019-03-14 ENCOUNTER — Ambulatory Visit
Admission: RE | Admit: 2019-03-14 | Discharge: 2019-03-14 | Disposition: A | Discharge status: Home / Self Care | Payer: 59 | Source: Ambulatory Visit | Attending: Obstetrics and Gynecology | Admitting: Obstetrics and Gynecology

## 2019-03-14 ENCOUNTER — Other Ambulatory Visit: Payer: Self-pay

## 2019-03-14 DIAGNOSIS — Z1231 Encounter for screening mammogram for malignant neoplasm of breast: Secondary | ICD-10-CM

## 2019-03-27 ENCOUNTER — Other Ambulatory Visit: Payer: Self-pay | Admitting: "Endocrinology

## 2019-05-08 ENCOUNTER — Other Ambulatory Visit: Payer: Self-pay | Admitting: "Endocrinology

## 2020-02-26 ENCOUNTER — Other Ambulatory Visit: Payer: Self-pay | Admitting: Obstetrics and Gynecology

## 2020-02-26 DIAGNOSIS — Z1231 Encounter for screening mammogram for malignant neoplasm of breast: Secondary | ICD-10-CM

## 2020-04-14 ENCOUNTER — Ambulatory Visit
Admission: RE | Admit: 2020-04-14 | Discharge: 2020-04-14 | Disposition: A | Discharge status: Home / Self Care | Payer: 59 | Source: Ambulatory Visit | Attending: Obstetrics and Gynecology | Admitting: Obstetrics and Gynecology

## 2020-04-14 ENCOUNTER — Other Ambulatory Visit: Payer: Self-pay

## 2020-04-14 DIAGNOSIS — Z1231 Encounter for screening mammogram for malignant neoplasm of breast: Secondary | ICD-10-CM

## 2021-08-06 ENCOUNTER — Other Ambulatory Visit: Payer: Self-pay | Admitting: Obstetrics and Gynecology

## 2021-08-06 DIAGNOSIS — Z1231 Encounter for screening mammogram for malignant neoplasm of breast: Secondary | ICD-10-CM

## 2021-08-10 ENCOUNTER — Ambulatory Visit
Admission: RE | Admit: 2021-08-10 | Discharge: 2021-08-10 | Disposition: A | Discharge status: Home / Self Care | Payer: BC Managed Care – PPO | Source: Ambulatory Visit | Attending: Obstetrics and Gynecology | Admitting: Obstetrics and Gynecology

## 2021-08-10 ENCOUNTER — Other Ambulatory Visit: Payer: Self-pay

## 2021-08-10 DIAGNOSIS — Z1231 Encounter for screening mammogram for malignant neoplasm of breast: Secondary | ICD-10-CM
